# Patient Record
Sex: Male | Born: 1990 | Race: White | Hispanic: No | Marital: Single | Smoking: Current some day smoker
Health system: Southern US, Community
[De-identification: ages and names within clinical notes are randomized; demographics above are authoritative.]

## PROBLEM LIST (undated history)

## (undated) DIAGNOSIS — I10 Essential (primary) hypertension: Secondary | ICD-10-CM

## (undated) DIAGNOSIS — T7840XA Allergy, unspecified, initial encounter: Secondary | ICD-10-CM

## (undated) HISTORY — PX: NO PAST SURGERIES: SHX2092

---
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
## 2010-01-20 ENCOUNTER — Emergency Department: Payer: Self-pay | Admitting: Unknown Physician Specialty

## 2011-03-11 ENCOUNTER — Ambulatory Visit: Payer: Self-pay | Admitting: Internal Medicine

## 2011-11-19 ENCOUNTER — Ambulatory Visit: Payer: Self-pay | Admitting: Emergency Medicine

## 2012-01-23 ENCOUNTER — Emergency Department: Payer: Self-pay | Admitting: Emergency Medicine

## 2015-03-12 ENCOUNTER — Encounter: Payer: Self-pay | Admitting: Emergency Medicine

## 2015-03-12 ENCOUNTER — Ambulatory Visit
Admission: EM | Admit: 2015-03-12 | Discharge: 2015-03-12 | Disposition: A | Discharge status: Home / Self Care | Payer: Federal, State, Local not specified - PPO | Attending: Family Medicine | Admitting: Family Medicine

## 2015-03-12 DIAGNOSIS — J069 Acute upper respiratory infection, unspecified: Secondary | ICD-10-CM | POA: Diagnosis not present

## 2015-03-12 MED ORDER — GUAIFENESIN-CODEINE 100-10 MG/5ML PO SOLN: 5.0000 mL | Freq: Three times a day (TID) | ORAL | Status: DC | PRN

## 2015-03-12 MED ORDER — LORATADINE-PSEUDOEPHEDRINE ER 5-120 MG PO TB12: 1.0000 | ORAL_TABLET | Freq: Two times a day (BID) | ORAL | Status: AC

## 2015-03-12 NOTE — ED Notes (Signed)
Patient c/o cough, sinus pain and congestion for the past 4 days.

## 2015-03-12 NOTE — ED Provider Notes (Signed)
[facility]  ____________________________________________  Time seen: Approximately 9:08 AM  I have reviewed the triage vital signs and the nursing notes.   HISTORY  Chief Complaint Facial Pain and Nasal Congestion   HPI ASMAR BROZEK is a 24 y.o. male presents for complaints of 4 day of runny nose, nasal congestion and intermittent cough. States feels worse at night, and states as day the day goes on "I dont even feel sick." States initially with some sore throat that has now resolved. Denies current pain. Reports continues to eat and drink.  States main complaint is cough at night, and can feel drainage in back of throat. Denies chest pain, wheezing, abdominal pain, fever, weakness or other complaints. Reports has continued to work throughout the week without difficulty.   History reviewed. No pertinent past medical history.  There are no active problems to display for this patient.   History reviewed. No pertinent past surgical history.  No current outpatient prescriptions on file.  Allergies Review of patient's allergies indicates no known allergies.  History reviewed. No pertinent family history.  Social History Social History  Substance Use Topics  . Smoking status: Current Some Day Smoker  . Smokeless tobacco: Former Neurosurgeon  . Alcohol Use: Yes    Review of Systems Constitutional: No fever/chills Eyes: No visual changes. ENT:  Positive runny nose, nasal congestion and intermittent cough.  Cardiovascular: Denies chest pain. Respiratory: Denies shortness of breath. Gastrointestinal: No abdominal pain.  No nausea, no vomiting.  No diarrhea.  No constipation. Genitourinary: Negative for dysuria. Musculoskeletal: Negative for back pain. Skin: Negative for rash. Neurological: Negative for headaches, focal weakness or numbness.  10-point ROS otherwise negative.  ____________________________________________   PHYSICAL EXAM:  VITAL SIGNS: ED Triage  Vitals  Enc Vitals Group     BP 03/12/15 0849 138/57 mmHg     Pulse Rate 03/12/15 0849 72     Resp 03/12/15 0849 16     Temp 03/12/15 0849 96.8 F (36 C)     Temp Source 03/12/15 0849 Tympanic     SpO2 03/12/15 0849 98 %     Weight 03/12/15 0849 240 lb (108.863 kg)     Height 03/12/15 0849  (1.93 m)     Head Cir --      Peak Flow --      Pain Score 03/12/15 0852 2     Pain Loc --      Pain Edu? --      Excl. in GC? --     Constitutional: Alert and oriented. Well appearing and in no acute distress. Eyes: Conjunctivae are normal. PERRL. EOMI. Head: Atraumatic.no sinus TTP, no swelling, no erythema.   Ears: no erythema, normal TMs bilaterally.   Nose: mild clear rhinorrhea, nares patent.   Mouth/Throat: Mucous membranes are moist.  Oropharynx non-erythematous. No tonsillar swelling or exudate.  Neck: No stridor.  No cervical spine tenderness to palpation. Hematological/Lymphatic/Immunilogical: No cervical lymphadenopathy. Cardiovascular: Normal rate, regular rhythm. Grossly normal heart sounds.  Good peripheral circulation. Respiratory: Normal respiratory effort.  No retractions. Lungs CTAB. Gastrointestinal: Soft and nontender. No distention. Normal Bowel sounds.   Musculoskeletal: No lower or upper extremity tenderness nor edema. Bilateral pedal pulses equal and easily palpated.  Neurologic:  Normal speech and language. No gross focal neurologic deficits are appreciated. No gait instability. Skin:  Skin is warm, dry and intact. No rash noted. Psychiatric: Mood and affect are normal. Speech and behavior are normal.  ____________________________________________   LABS (all  labs ordered are listed, but only abnormal results are displayed)  Labs Reviewed - No data to display  INITIAL IMPRESSION / ASSESSMENT AND PLAN / ED COURSE  Pertinent labs & imaging results that were available during my care of the patient were reviewed by me and considered in my medical decision  making (see chart for details).  Very well-appearing patient. No acute distress. Presents for the complaints of 4 days of runny nose, congestion and occasional cough. Lungs clear throughout. Mild clear rhinorrhea. Suspect viral upper respiratory infection. Will treat supportively with prn claritin-d and guaifenesin/codeine, rest, fluids, prn otc ibuprofen and tylenol.   Discussed follow up with Primary care physician this week. Discussed follow up and return parameters including no resolution or any worsening concerns. Patient verbalized understanding and agreed to plan.   ____________________________________________   FINAL CLINICAL IMPRESSION(S) / ED DIAGNOSES  Final diagnoses:  Upper respiratory infection       Renford DillsLindsey Jaylee Freeze, NP 03/12/15 [phone]

## 2015-03-12 NOTE — Discharge Instructions (Signed)
Take medication as prescribed. Rest. Drink plenty of fluids. Take over the counter tylenol or ibuprofen as needed.   Follow up with your primary care physician this week as needed. Return to Urgent care as needed for new or worsening concerns.   Upper Respiratory Infection, Adult Most upper respiratory infections (URIs) are a viral infection of the air passages leading to the lungs. A URI affects the nose, throat, and upper air passages. The most common type of URI is nasopharyngitis and is typically referred to as "the common cold." URIs run their course and usually go away on their own. Most of the time, a URI does not require medical attention, but sometimes a bacterial infection in the upper airways can follow a viral infection. This is called a secondary infection. Sinus and middle ear infections are common types of secondary upper respiratory infections. Bacterial pneumonia can also complicate a URI. A URI can worsen asthma and chronic obstructive pulmonary disease (COPD). Sometimes, these complications can require emergency medical care and may be life threatening.  CAUSES Almost all URIs are caused by viruses. A virus is a type of germ and can spread from one person to another.  RISKS FACTORS You may be at risk for a URI if:   You smoke.   You have chronic heart or lung disease.  You have a weakened defense (immune) system.   You are very young or very old.   You have nasal allergies or asthma.  You work in crowded or poorly ventilated areas.  You work in health care facilities or schools. SIGNS AND SYMPTOMS  Symptoms typically develop 2-3 days after you come in contact with a cold virus. Most viral URIs last 7-10 days. However, viral URIs from the influenza virus (flu virus) can last 14-18 days and are typically more severe. Symptoms may include:   Runny or stuffy (congested) nose.   Sneezing.   Cough.   Sore throat.   Headache.   Fatigue.   Fever.   Loss  of appetite.   Pain in your forehead, behind your eyes, and over your cheekbones (sinus pain).  Muscle aches.  DIAGNOSIS  Your health care provider may diagnose a URI by:  Physical exam.  Tests to check that your symptoms are not due to another condition such as:  Strep throat.  Sinusitis.  Pneumonia.  Asthma. TREATMENT  A URI goes away on its own with time. It cannot be cured with medicines, but medicines may be prescribed or recommended to relieve symptoms. Medicines may help:  Reduce your fever.  Reduce your cough.  Relieve nasal congestion. HOME CARE INSTRUCTIONS   Take medicines only as directed by your health care provider.   Gargle warm saltwater or take cough drops to comfort your throat as directed by your health care provider.  Use a warm mist humidifier or inhale steam from a shower to increase air moisture. This may make it easier to breathe.  Drink enough fluid to keep your urine clear or pale yellow.   Eat soups and other clear broths and maintain good nutrition.   Rest as needed.   Return to work when your temperature has returned to normal or as your health care provider advises. You may need to stay home longer to avoid infecting others. You can also use a face mask and careful hand washing to prevent spread of the virus.  Increase the usage of your inhaler if you have asthma.   Do not use any tobacco products, including  cigarettes, chewing tobacco, or electronic cigarettes. If you need help quitting, ask your health care provider. PREVENTION  The best way to protect yourself from getting a cold is to practice good hygiene.   Avoid oral or hand contact with people with cold symptoms.   Wash your hands often if contact occurs.  There is no clear evidence that vitamin C, vitamin E, echinacea, or exercise reduces the chance of developing a cold. However, it is always recommended to get plenty of rest, exercise, and practice good nutrition.    SEEK MEDICAL CARE IF:   You are getting worse rather than better.   Your symptoms are not controlled by medicine.   You have chills.  You have worsening shortness of breath.  You have brown or red mucus.  You have yellow or brown nasal discharge.  You have pain in your face, especially when you bend forward.  You have a fever.  You have swollen neck glands.  You have pain while swallowing.  You have white areas in the back of your throat. SEEK IMMEDIATE MEDICAL CARE IF:   You have severe or persistent:  Headache.  Ear pain.  Sinus pain.  Chest pain.  You have chronic lung disease and any of the following:  Wheezing.  Prolonged cough.  Coughing up blood.  A change in your usual mucus.  You have a stiff neck.  You have changes in your:  Vision.  Hearing.  Thinking.  Mood. MAKE SURE YOU:   Understand these instructions.  Will watch your condition.  Will get help right away if you are not doing well or get worse.   This information is not intended to replace advice given to you by your health care provider. Make sure you discuss any questions you have with your health care provider.   Document Released: 09/20/2000 Document Revised: 08/11/2014 Document Reviewed: 07/02/2013 Elsevier Interactive Patient Education [employer]2016 Elsevier Inc.

## 2016-04-28 ENCOUNTER — Ambulatory Visit
Admission: EM | Admit: 2016-04-28 | Discharge: 2016-04-28 | Disposition: A | Discharge status: Home / Self Care | Payer: Federal, State, Local not specified - PPO | Attending: Family Medicine | Admitting: Family Medicine

## 2016-04-28 ENCOUNTER — Encounter: Payer: Self-pay | Admitting: Emergency Medicine

## 2016-04-28 DIAGNOSIS — B9789 Other viral agents as the cause of diseases classified elsewhere: Secondary | ICD-10-CM

## 2016-04-28 DIAGNOSIS — J069 Acute upper respiratory infection, unspecified: Secondary | ICD-10-CM | POA: Diagnosis not present

## 2016-04-28 LAB — RAPID STREP SCREEN (MED CTR MEBANE ONLY): Streptococcus, Group A Screen (Direct): NEGATIVE

## 2016-04-28 MED ORDER — CETIRIZINE-PSEUDOEPHEDRINE ER 5-120 MG PO TB12: 1.0000 | ORAL_TABLET | Freq: Two times a day (BID) | ORAL | 0 refills | Status: DC

## 2016-04-28 MED ORDER — FLUTICASONE PROPIONATE 50 MCG/ACT NA SUSP: 2.0000 | Freq: Every day | NASAL | 0 refills | Status: DC

## 2016-04-28 MED ORDER — HYDROCOD POLST-CPM POLST ER 10-8 MG/5ML PO SUER: 5.0000 mL | Freq: Two times a day (BID) | ORAL | 0 refills | Status: DC

## 2016-04-28 NOTE — ED Triage Notes (Signed)
Patient c/o cough and chest congestion for the past 2-3 weeks.   Patient c/o sore throat that started 3 days ago.

## 2016-04-28 NOTE — ED Provider Notes (Signed)
CSN: [id number]     Arrival date & time 04/28/16  4098 History   First MD Initiated Contact with Patient 04/28/16 0919     Chief Complaint  Patient presents with  . Cough   (Consider location/radiation/quality/duration/timing/severity/associated sxs/prior Treatment) HPI This a 26 year old male who presents with chest congestion for about 3 weeks. Complained of the sore throat that started about 3 days ago. The cough is keeping him awake at night. In review of his records he was seen on December 2 here; diagnosed with a URI and provided symptomatic treatment. He did improve somewhat but it returns now with increased coughing. He is a nonsmoker. He has not been running a fever. He did not receive his flu shot this year.    History reviewed. No pertinent past medical history. History reviewed. No pertinent surgical history. History reviewed. No pertinent family history. Social History  Substance Use Topics  . Smoking status: Current Some Day Smoker  . Smokeless tobacco: Former Neurosurgeon  . Alcohol use Yes    Review of Systems  Constitutional: Positive for activity change and chills. Negative for fatigue and fever.  HENT: Positive for congestion, sinus pain, sinus pressure, sneezing, sore throat and voice change.   Respiratory: Positive for cough. Negative for shortness of breath, wheezing and stridor.   All other systems reviewed and are negative.   Allergies  Patient has no known allergies.  Home Medications   Prior to Admission medications   Medication Sig Start Date End Date Taking? Authorizing Provider  cetirizine-pseudoephedrine (ZYRTEC-D) 5-120 MG tablet Take 1 tablet by mouth 2 (two) times daily. 04/28/16   Lutricia Feil, PA-C  chlorpheniramine-HYDROcodone (TUSSIONEX PENNKINETIC ER) 10-8 MG/5ML SUER Take 5 mLs by mouth 2 (two) times daily. 04/28/16   Lutricia Feil, PA-C  fluticasone (FLONASE) 50 MCG/ACT nasal spray Place 2 sprays into both nostrils daily. 04/28/16   Lutricia Feil, PA-C   Meds Ordered and Administered this Visit  Medications - No data to display  BP 131/70 (BP Location: Left Arm)   Pulse 73   Temp 98.1 F (36.7 C) (Oral)   Resp 16   Ht 6\' 4"  (1.93 m)   Wt 235 lb (106.6 kg)   SpO2 100%   BMI 28.61 kg/m  No data found.   Physical Exam  Constitutional: He is oriented to person, place, and time. He appears well-developed and well-nourished. No distress.  HENT:  Head: Normocephalic and atraumatic.  Right Ear: External ear normal.  Left Ear: External ear normal.  Nose: Nose normal.  Mouth/Throat: Oropharynx is clear and moist. No oropharyngeal exudate.  Is no tenderness to percussion over the sinuses  Eyes: EOM are normal. Pupils are equal, round, and reactive to light. Right eye exhibits no discharge. Left eye exhibits no discharge.  Neck: Normal range of motion. Neck supple.  Pulmonary/Chest: Effort normal and breath sounds normal. No respiratory distress. He has no wheezes. He has no rales.  Musculoskeletal: Normal range of motion.  Lymphadenopathy:    He has no cervical adenopathy.  Neurological: He is alert and oriented to person, place, and time.  Skin: Skin is warm and dry. He is not diaphoretic.  Psychiatric: He has a normal mood and affect. His behavior is normal. Judgment and thought content normal.  Nursing note and vitals reviewed.   Urgent Care Course     Procedures (including critical care time)  Labs Review Labs Reviewed  RAPID STREP SCREEN (NOT AT [facility])  CULTURE[facility])    Imaging Review No results found.   Visual Acuity Review  Right Eye Distance:   Left Eye Distance:   Bilateral Distance:    Right Eye Near:   Left Eye Near:    Bilateral Near:     I [place] substance abuse registry with negative findings.    MDM   1. Viral URI with cough    Discharge Medication List as of 04/28/2016  9:35 AM    START taking these medications   Details   cetirizine-pseudoephedrine (ZYRTEC-D) 5-120 MG tablet Take 1 tablet by mouth 2 (two) times daily., Starting Fri 04/28/2016, Normal    chlorpheniramine-HYDROcodone (TUSSIONEX PENNKINETIC ER) 10-8 MG/5ML SUER Take 5 mLs by mouth 2 (two) times daily., Starting Fri 04/28/2016, Print    fluticasone (FLONASE) 50 MCG/ACT nasal spray Place 2 sprays into both nostrils daily., Starting Fri 04/28/2016, Normal      Plan: 1. Test/x-ray results and diagnosis reviewed with patient 2. rx as per orders; risks, benefits, potential side effects reviewed with patient 3. Recommend supportive treatment with Rest and fluids. Will provide him Tussionex to allow him to have better  Rest at nighttime without coughing. He is cautioned regarding performing activities requiring concentration and judgment and not to drive while taking Tussionex. If he is not improving he should follow-up with the primary care physician. 4. F/u prn if symptoms worsen or don't improve     Lutricia FeilWilliam P Zadiel Leyh, PA-C 04/28/16 0945

## 2016-05-01 LAB — CULTURE, GROUP A STREP (THRC)

## 2018-09-23 ENCOUNTER — Other Ambulatory Visit: Payer: Self-pay

## 2018-09-23 ENCOUNTER — Ambulatory Visit
Admission: EM | Admit: 2018-09-23 | Discharge: 2018-09-23 | Disposition: A | Discharge status: Home / Self Care | Payer: Managed Care, Other (non HMO) | Attending: Emergency Medicine | Admitting: Emergency Medicine

## 2018-09-23 DIAGNOSIS — L237 Allergic contact dermatitis due to plants, except food: Secondary | ICD-10-CM

## 2018-09-23 MED ORDER — FAMOTIDINE 20 MG PO TABS: 20.0000 mg | ORAL_TABLET | Freq: Two times a day (BID) | ORAL | 0 refills | Status: DC

## 2018-09-23 MED ORDER — ZYRTEC ALLERGY 10 MG PO CAPS: 10.0000 mg | ORAL_CAPSULE | Freq: Every day | ORAL | Status: DC

## 2018-09-23 MED ORDER — TRIAMCINOLONE ACETONIDE 40 MG/ML IJ SUSP: 80.0000 mg | Freq: Once | INTRAMUSCULAR | Status: DC

## 2018-09-23 MED ORDER — METHYLPREDNISOLONE SODIUM SUCC 125 MG IJ SOLR: 125.0000 mg | Freq: Once | INTRAMUSCULAR | Status: DC

## 2018-09-23 MED ORDER — TRIAMCINOLONE ACETONIDE 40 MG/ML IJ SUSP
80.0000 mg | Freq: Once | INTRAMUSCULAR | Status: AC
  Administered 2018-09-23: 80 mg via INTRAMUSCULAR

## 2018-09-23 NOTE — ED Triage Notes (Signed)
Patient states that he has been having poision ivy on his left arm after riding this weekend. Patient states that the rash has continued to spread and states that he has been using Calamine lotion on it.
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
## 2019-03-10 ENCOUNTER — Ambulatory Visit
Admission: EM | Admit: 2019-03-10 | Discharge: 2019-03-10 | Disposition: A | Discharge status: Home / Self Care | Payer: Managed Care, Other (non HMO) | Attending: Family Medicine | Admitting: Family Medicine

## 2019-03-10 ENCOUNTER — Encounter: Payer: Self-pay | Admitting: Emergency Medicine

## 2019-03-10 ENCOUNTER — Other Ambulatory Visit: Payer: Self-pay

## 2019-03-10 DIAGNOSIS — I10 Essential (primary) hypertension: Secondary | ICD-10-CM

## 2019-03-10 MED ORDER — AMLODIPINE BESYLATE 5 MG PO TABS: 5.0000 mg | ORAL_TABLET | Freq: Every day | ORAL | 1 refills | Status: DC

## 2019-03-10 NOTE — ED Triage Notes (Signed)
Patient in today c/o dizziness x 3 months, getting worse. Patient has started getting headaches x 3 weeks. Originally the dizziness would be when he bent over and stood back up and would only last a couple of seconds. Today patient has been dizzy and headache x 6 hours.

## 2019-03-10 NOTE — Discharge Instructions (Signed)
Medication as directed.  Start back exercising (slowly). Eat right, get adequate sleep. Stop smoking.  Follow up with [facility] clinic.  Take care  Dr. Lacinda Axon

## 2019-03-10 NOTE — ED Provider Notes (Signed)
[facility]    CSN: [id number] Arrival date & time: 03/10/19  1719  History   Chief Complaint Chief Complaint  Patient presents with  . Dizziness   HPI  28 year old male presents with multiple complaints.  Patient reports that he is not felt well for the past 3 months.  He reports ongoing dizziness which has become more frequent.  He states that it feels like he is going to pass out.  Often occurs when he bends over and stands up quickly.  However, it is occurring more frequently now.  Additionally, over the past 3 weeks he has been experiencing frequent headaches.  Located in the frontal region.  States that they last for minutes and sometimes much longer.  He currently has a headache and rates his pain as 5/10 in severity.  He has taken ibuprofen without improvement.  Patient also reports ongoing fatigue and shortness of breath with activity.  Patient states that he is no longer physically active as he was before the COVID-19 pandemic.  He is concerned about his symptoms and has been on the Internet.  He has been putting off coming in for evaluation but states that he can no longer do so.  No relieving factors.  No other reported symptoms.  PMH, Surgical Hx, Family Hx, Social History reviewed and updated as below.  PMH: Elevated BP  Past Surgical History:  Procedure Laterality Date  . NO PAST SURGERIES     Home Medications    Prior to Admission medications   Medication Sig Start Date End Date Taking? Authorizing Provider  amLODipine (NORVASC) 5 MG tablet Take 1 tablet (5 mg total) by mouth daily. 03/10/19   Tommie Sams, DO  Cetirizine HCl (ZYRTEC ALLERGY) 10 MG CAPS Take 1 capsule (10 mg total) by mouth daily for 14 days. 09/23/18 03/10/19  Bailey Mech, NP  famotidine (PEPCID) 20 MG tablet Take 1 tablet (20 mg total) by mouth 2 (two) times daily for 14 days. 09/23/18 03/10/19  Bailey Mech, NP  fluticasone (FLONASE) 50 MCG/ACT nasal spray Place 2 sprays into  both nostrils daily. 04/28/16 03/10/19  Lutricia Feil, PA-C    Family History Family History  Problem Relation Age of Onset  . Colon cancer Mother   . Hypertension Mother   . Thyroid disease Mother   . Diabetes Father   . Hypertension Father     Social History Social History   Tobacco Use  . Smoking status: Current Some Day Smoker    Packs/day: 0.50    Types: Cigarettes  . Smokeless tobacco: Former Engineer, water Use Topics  . Alcohol use: Yes    Comment: occasional  . Drug use: Not Currently     Allergies   Patient has no known allergies.   Review of Systems Review of Systems  Constitutional: Positive for fatigue.  Respiratory: Positive for shortness of breath.   Neurological: Positive for dizziness and headaches.   Physical Exam Triage Vital Signs ED Triage Vitals  Enc Vitals Group     BP 03/10/19 1732 (!) 141/126     Pulse Rate 03/10/19 1732 92     Resp 03/10/19 1732 18     Temp 03/10/19 1732 98.4 F (36.9 C)     Temp Source 03/10/19 1732 Oral     SpO2 03/10/19 1732 100 %     Weight 03/10/19 1731 250 lb (113.4 kg)     Height 03/10/19 1731 6\' 4"  (1.93 m)     Head Circumference --  Peak Flow --      Pain Score 03/10/19 1731 5     Pain Loc --      Pain Edu? --      Excl. in [place]? --    Updated Vital Signs BP (!) 146/97 (BP Location: Right Arm)   Pulse 92   Temp 98.4 F (36.9 C) (Oral)   Resp 18   Ht 6\' 4"  (1.93 m)   Wt 113.4 kg   SpO2 100%   BMI 30.43 kg/m   Visual Acuity Right Eye Distance:   Left Eye Distance:   Bilateral Distance:    Right Eye Near:   Left Eye Near:    Bilateral Near:     Physical Exam Vitals signs and nursing note reviewed.  Constitutional:      General: He is not in acute distress.    Appearance: Normal appearance. He is not ill-appearing.  HENT:     Head: Normocephalic and atraumatic.     Nose: Nose normal.  Eyes:     General:        Right eye: No discharge.        Left eye: No discharge.      Conjunctiva/sclera: Conjunctivae normal.  Cardiovascular:     Rate and Rhythm: Normal rate and regular rhythm.     Heart sounds: No murmur.  Pulmonary:     Effort: Pulmonary effort is normal.     Breath sounds: Normal breath sounds. No wheezing, rhonchi or rales.  Skin:    General: Skin is warm.     Findings: No rash.  Neurological:     General: No focal deficit present.     Mental Status: He is alert and oriented to person, place, and time.  Psychiatric:        Behavior: Behavior normal.     Comments: Anxious. Perseverative.     UC Treatments / Results  Labs (all labs ordered are listed, but only abnormal results are displayed) Labs Reviewed - No data to display  EKG   Radiology No results found.  Procedures Procedures (including critical care time)  Medications Ordered in UC Medications - No data to display  Initial Impression / Assessment and Plan / UC Course  I have reviewed the triage vital signs and the nursing notes.  Pertinent labs & imaging results that were available during my care of the patient were reviewed by me and considered in my medical decision making (see chart for details).    28 year old male presents with ongoing dizziness, headaches.  Also experiencing fatigue and shortness of breath.  Patient hypertensive here.  Has had prior elevated blood pressures without a diagnosis of hypertension.  I believe that hypertension is the culprit of his headaches and dizziness.  In regards to his shortness of breath and fatigue, I believe this is at least in part due to weight gain, decreased activity, and anxiety.  Advised to stop smoking.  Advised to start exercising slowly.  Advised to eat right and get adequate sleep.  Placing on amlodipine.  Advised to follow-up with [facility].  Final Clinical Impressions(s) / UC Diagnoses   Final diagnoses:  Essential hypertension     Discharge Instructions     Medication as directed.  Start back exercising  (slowly). Eat right, get adequate sleep. Stop smoking.  Follow up with [facility] clinic.  Take care  Dr. Lacinda Axon    ED Prescriptions    Medication Sig Dispense Auth. Provider   amLODipine (NORVASC) 5 MG tablet  Take 1 tablet (5 mg total) by mouth daily. 30 tablet Tommie Samsook, Bettyjean Stefanski G, DO     PDMP not reviewed this encounter.   Tommie SamsCook, Jimia Gentles G, [place]DO 03/10/19 2042

## 2019-03-17 DIAGNOSIS — I10 Essential (primary) hypertension: Secondary | ICD-10-CM | POA: Insufficient documentation

## 2019-03-17 DIAGNOSIS — R5383 Other fatigue: Secondary | ICD-10-CM | POA: Insufficient documentation

## 2019-03-17 DIAGNOSIS — F17200 Nicotine dependence, unspecified, uncomplicated: Secondary | ICD-10-CM | POA: Insufficient documentation

## 2019-03-19 DIAGNOSIS — E538 Deficiency of other specified B group vitamins: Secondary | ICD-10-CM | POA: Insufficient documentation

## 2019-03-19 DIAGNOSIS — R7989 Other specified abnormal findings of blood chemistry: Secondary | ICD-10-CM | POA: Insufficient documentation

## 2019-04-14 DIAGNOSIS — G43009 Migraine without aura, not intractable, without status migrainosus: Secondary | ICD-10-CM | POA: Insufficient documentation

## 2019-04-22 ENCOUNTER — Other Ambulatory Visit: Payer: Self-pay

## 2019-04-22 ENCOUNTER — Ambulatory Visit (INDEPENDENT_AMBULATORY_CARE_PROVIDER_SITE_OTHER): Payer: Managed Care, Other (non HMO) | Admitting: Urology

## 2019-04-22 ENCOUNTER — Encounter: Payer: Self-pay | Admitting: Urology

## 2019-04-22 VITALS — BP 143/78 | HR 81 | Ht 75.0 in | Wt 255.0 lb

## 2019-04-22 DIAGNOSIS — R7989 Other specified abnormal findings of blood chemistry: Secondary | ICD-10-CM

## 2019-04-22 NOTE — Progress Notes (Signed)
   04/22/19 4:39 PM   Steve Nielsen 1990-08-18 [id number]  Referring provider: Lorenso Quarry, NP [address] [place],  [place] [address]  CC: Low testosterone  HPI: I saw Steve Nielsen in urology clinic today in consultation for low testosterone from Lorenso Quarry, NP.  He was seen in the emergency department at the end of November with severe headaches and some changes in vision, and found to have hypertension.  The symptoms improved with blood pressure medications.  He also reports 2 to 39-month history of decreased energy, fatigue, and decreased libido.  He denies any erectile dysfunction.  There are no aggravating or alleviating factors.  Severity is mild to moderate.  He denies any gross hematuria or flank pain.  He has 1 son and does not desire further biologic children at this time.  He used to be a heavy drinker with 14-15 beers on the weekend, but reports he has cut back over the last few weeks.  He denies any drug use.  He does not exercise regularly.  A testosterone was checked on 03/17/2019 and was low at 255.   PMH: Migraines  Surgical History: Past Surgical History:  Procedure Laterality Date  . NO PAST SURGERIES      Allergies: No Known Allergies  Family History: Family History  Problem Relation Age of Onset  . Colon cancer Mother   . Hypertension Mother   . Thyroid disease Mother   . Diabetes Father   . Hypertension Father     Social History:  reports that he has been smoking cigarettes. He has been smoking about 0.50 packs per day. He has quit using smokeless tobacco. He reports current alcohol use. He reports previous drug use.  ROS: Please see flowsheet from today's date for complete review of systems.  Physical Exam: BP (!) 143/78 (BP Location: Left Arm, Patient Position: Sitting, Cuff Size: Large)   Pulse 81   Ht 6\' 3"  (1.905 m)   Wt 255 lb (115.7 kg)   BMI 31.87 kg/m    Constitutional:  Alert and oriented, No acute distress. Cardiovascular: No  clubbing, cyanosis, or edema. Respiratory: Normal respiratory effort, no increased work of breathing. GI: Abdomen is soft, nontender, nondistended, no abdominal masses GU: No CVA tenderness Lymph: No cervical or inguinal lymphadenopathy. Skin: No rashes, bruises or suspicious lesions. Neurologic: Grossly intact, no focal deficits, moving all 4 extremities. Psychiatric: Normal mood and affect.  Laboratory Data: Reviewed, see HPI 03/17/2019 Hematocrit 43.2 Testosterone 255  Pertinent Imaging: None to review  Assessment & Plan:   In summary, the patient is a 29 year old male with hypertension and 67-month history of worsening fatigue, decreased energy, and decreased libido.  He has a single low testosterone value of 255.  We discussed the AUA guidelines regarding low testosterone evaluation and replacement and the need for repeat blood work.  We also discussed the role of exogenous testosterone on fertility in the bodies production of natural testosterone.  We also discussed the negative impact of heavy alcohol consumption on testosterone, and the positive impact of exercise.  RTC for repeat testosterone and LH Follow-up with PA to discuss results and possible testosterone replacement  A total of 40 minutes were spent face-to-face with the patient, greater than 50% was spent in patient education, counseling, and coordination of care regarding low testosterone and AUA guidelines regarding work-up and replacement.   2-month, MD  [facility] [address] [place], [place] [phone]

## 2019-04-22 NOTE — Patient Instructions (Addendum)
Testosterone Replacement Therapy  Testosterone replacement therapy (TRT) is used to treat men who have a low testosterone level (hypogonadism). Testosterone is a male hormone that is produced in the testicles. It is responsible for typically male characteristics and for maintaining a man's sex drive and the ability to get an erection. Testosterone also supports bone and muscle health. TRT can be a gel, liquid, or patch that you put on your skin. It can also be in the form of a tablet or an injection. In some cases, your health care provider may insert long-acting pellets under your skin. In most men, the level of testosterone starts to decline gradually after age 45. Low testosterone can also be caused by certain medical conditions, medicines, and obesity. Your health care provider can diagnose hypogonadism with at least two blood tests that are done early in the morning. Low testosterone may not need to be treated. TRT is usually a choice that you make with your health care provider. Your health care provider may recommend TRT if you have low testosterone that is causing symptoms, such as:  Low sex drive.  Erection problems.  Breast enlargement.  Loss of body hair.  Weak muscles or bones.  Shrinking testicles.  Increased body fat.  Low energy.  Hot flashes.  Depression.  Decreased work performance. TRT is a lifetime treatment. If you stop treatment, your testosterone will drop, and your symptoms may return. What are the risks? Testosterone replacement therapy may have side effects, including:  Lower sperm count.  Skin irritation at the application or injection site.  Mouth irritation if you take an oral tablet.  Acne.  Swelling of your legs or feet.  Tender breasts.  Dizziness.  Sleep disturbance.  Mood swings.  Possible increased risk of stroke or heart attack. Testosterone replacement therapy may also increase your risk for prostate cancer or male breast cancer.  You should not use TRT if you have either of those conditions. Your health care provider also may not recommend TRT if:  You are suspected of having prostate cancer.  You want to father a child.  You have a high number of red blood cells.  You have untreated sleep apnea.  You have a very large prostate. Supplies needed:  Your health care provider will prescribe the testosterone gel, solution, or medicine that you need. If your health care provider teaches you to do self-injections at home, you will also need: ? Your medicine vial. ? Disposable needles and syringes. ? Alcohol swabs. ? A needle disposal container. ? Adhesive bandages. How to use testosterone replacement therapy Your health care provider will help you find the TRT option that will work best for you based on your preference, the side effects, and the cost. You may:  Rub testosterone gel on your upper arm or shoulder every day after a shower. This is the most common type of TRT. Do not let women or children come in contact with the gel.  Apply a testosterone solution under your arms once each day.  Place a testosterone patch on your skin once each day.  Dissolve a testosterone tablet in your mouth twice each day.  Have a testosterone pellet inserted under your skin by your health care provider. This will be replaced every 3-6 months.  Use testosterone nasal spray three times each day.  Get testosterone injections. For some types of testosterone, your health care provider will give you this injection. With other types of testosterone, you may be taught to give injections to   yourself. The frequency of injections may vary based on the type of testosterone that you receive. Follow these instructions at home:  Take over-the-counter and prescription medicines only as told by your health care provider.  Lose weight if you are overweight. Ask your health care provider to help you start a healthy diet and exercise program to  reach and maintain a healthy weight.  Work with your health care provider to treat other medical conditions that may lower your testosterone. These include obesity, high blood pressure, high cholesterol, diabetes, liver disease, kidney disease, and sleep apnea.  Keep all follow-up visits as told by your health care provider. This is important. General recommendations  Discuss all risks and benefits with your health care provider before starting therapy.  Work with your health care provider to check your prostate health and do blood testing before you start therapy.  Do not use any testosterone replacement therapies that are not prescribed by your health care provider or not approved for use in the [place]  Do not use TRT for bodybuilding or to improve sexual performance. TRT should be used only to treat symptoms of low testosterone.  Return for all repeat prostate checks and blood tests during therapy, as told by your health care provider. Where to find more information Learn more about testosterone replacement therapy from:  American [employer] Foundation: www.urologyhealth.org/urologic-conditions/low-testosterone-(hypogonadism)  Endocrine Society: www.hormone.org/diseases-and-conditions/mens-health/hypogonadism Contact a health care provider if:  You have side effects from your testosterone replacement therapy.  You continue to have symptoms of low testosterone during treatment.  You develop new symptoms during treatment. Summary  Testosterone replacement therapy is only for men who have low testosterone as determined by blood testing and who have symptoms of low testosterone.  Testosterone replacement therapy should be prescribed only by a health care provider and should be used under the supervision of a health care provider.  You may not be able to take testosterone if you have certain medical conditions, including prostate cancer, male breast cancer, or heart  disease.  Testosterone replacement therapy may have side effects and may make some medical conditions worse.  Talk with your health care provider about all the risks and benefits before you start therapy. This information is not intended to replace advice given to you by your health care provider. Make sure you discuss any questions you have with your health care provider. Document Revised: 04/09/2016 Document Reviewed: 12/16/2015 Elsevier Patient Education  2020 [employer]  

## 2019-04-23 ENCOUNTER — Other Ambulatory Visit
Admission: RE | Admit: 2019-04-23 | Discharge: 2019-04-23 | Disposition: A | Discharge status: Home / Self Care | Payer: Managed Care, Other (non HMO) | Attending: Urology | Admitting: Urology

## 2019-04-23 ENCOUNTER — Other Ambulatory Visit: Payer: Self-pay

## 2019-04-23 DIAGNOSIS — R7989 Other specified abnormal findings of blood chemistry: Secondary | ICD-10-CM | POA: Diagnosis not present

## 2019-04-24 LAB — LUTEINIZING HORMONE: LH: 11.6 m[IU]/mL — ABNORMAL HIGH (ref 1.7–8.6)

## 2019-04-25 LAB — TESTOSTERONE,FREE AND TOTAL
Testosterone, Free: 12.1 pg/mL (ref 9.3–26.5)
Testosterone: 232 ng/dL — ABNORMAL LOW (ref 264–916)

## 2019-04-27 NOTE — Progress Notes (Signed)
04/28/2019 2:39 PM   OSLO HUNTSMAN Nov 11, 1990 [id number]  Referring provider: No referring provider defined for this encounter.  Chief Complaint  Patient presents with  . Hypogonadism    HPI: Mr. Steve Nielsen is a 29 year old male with hypogonadism who presents today for further evaluation and management.  He was seen initially at our clinic on 04/22/2019 with Dr. Richardo Hanks as a referral from Lorenso Quarry, NP for a testosterone of 255 ng/dL.   He has a history of heavy alcohol and marijuana usage.  One biological child and does not desire more children.  A morning serum testosterone level at that visit was 232 ng/dL.    Testosterone deficiency Patient is experiencing a decrease in libido, a lack of energy, a decrease in strength, a decreased enjoyment in life, sadness and/or grumpiness, erections being less strong, a recent deterioration in an ability to play sports, falling asleep after dinner and a recent deterioration in their work performance.  This is indicated by his responses to the ADAM questionnaire.  He is no longer having spontaneous erections at night.   He does not have sleep apnea.  He is reporting obesity and poor memory.   His LH is elevated at 11.6.  He admits to using steroids for a few weeks five years ago.  He does not admit to pituitary issues, but he continues to have headaches and vision issues.     Androgen Deficiency in the Aging Male    Row Name 04/28/19 1300         Androgen Deficiency in the Aging Male   Do you have a decrease in libido (sex drive)  Yes     Do you have lack of energy  Yes     Do you have a decrease in strength and/or endurance  Yes     Have you lost height  No     Have you noticed a decreased "enjoyment of life"  Yes     Are you sad and/or grumpy  Yes     Are your erections less strong  Yes     Have you noticed a recent deterioration in your ability to play sports  Yes     Are you falling asleep after dinner  Yes     Has there been a  recent deterioration in your work performance  Yes       Erectile dysfunction SHIM score: 22    Main complaint: No libido x one year Risk factors:  Hypogonadism and smoking No painful erections or curvatures with his erections.    No longer having spontaneous erections.   SHIM    Row Name 04/28/19 1327         SHIM: Over the last 6 months:   How do you rate your confidence that you could get and keep an erection?  Moderate     When you had erections with sexual stimulation, how often were your erections hard enough for penetration (entering your partner)?  Almost Always or Always     During sexual intercourse, how often were you able to maintain your erection after you had penetrated (entered) your partner?  Almost Always or Always     During sexual intercourse, how difficult was it to maintain your erection to completion of intercourse?  Slightly Difficult     When you attempted sexual intercourse, how often was it satisfactory for you?  Almost Always or Always       SHIM Total Score  SHIM  22        Score: 1-7 Severe ED 8-11 Moderate ED 12-16 Mild-Moderate ED 17-21 Mild ED 22-25 No ED    BPH WITH LUTS  (prostate and/or bladder) IPSS score: 8/2   Major complaint(s):  Frequency and nocturia x years. Denies any dysuria, hematuria or suprapubic pain.   Denies any recent fevers, chills, nausea or vomiting.  He does not have a family history of PCa.  IPSS    Row Name 04/28/19 1300         International Prostate Symptom Score   How often have you had the sensation of not emptying your bladder?  Not at All     How often have you had to urinate less than every two hours?  Almost always     How often have you found you stopped and started again several times when you urinated?  Not at All     How often have you found it difficult to postpone urination?  Less than half the time     How often have you had a weak urinary stream?  Not at All     How often have you had to strain  to start urination?  Not at All     How many times did you typically get up at night to urinate?  1 Time     Total IPSS Score  8       Quality of Life due to urinary symptoms   If you were to spend the rest of your life with your urinary condition just the way it is now how would you feel about that?  Mostly Satisfied        Score:  1-7 Mild 8-19 Moderate 20-35 Severe    PMH: History reviewed. No pertinent past medical history.  Surgical History: Past Surgical History:  Procedure Laterality Date  . NO PAST SURGERIES      Home Medications:  Allergies as of 04/28/2019   No Known Allergies     Medication List       Accurate as of April 28, 2019  2:39 PM. If you have any questions, ask your nurse or doctor.        amLODipine 10 MG tablet Commonly known as: NORVASC Take 10 mg by mouth daily.   [employer] NASAL ALLERGY SPRAY NA SMARTSIG:2 Spray(s) Both Nares Daily   cyanocobalamin 1000 MCG tablet Take 2 tablets daily for 2 weeks, then reduce to 1 tablet daily thereafter for Vitamin B12 Deficiency.   loratadine 10 MG tablet Commonly known as: CLARITIN Take 10 mg by mouth daily.   losartan 50 MG tablet Commonly known as: COZAAR TAKE 1/2 TABLET DAILY FOR 1 WEEK, THEN INCREASE TO 1 WHOLE TABLET.   testosterone cypionate 200 MG/ML injection Commonly known as: DEPOTESTOSTERONE CYPIONATE Inject 1 mL (200 mg total) into the muscle every 14 (fourteen) days. Started by: Michiel Cowboy, PA-C       Allergies: No Known Allergies  Family History: Family History  Problem Relation Age of Onset  . Colon cancer Mother   . Hypertension Mother   . Thyroid disease Mother   . Diabetes Father   . Hypertension Father     Social History:  reports that he has been smoking cigarettes. He has been smoking about 0.50 packs per day. He has quit using smokeless tobacco. He reports current alcohol use. He reports previous drug use.  ROS: UROLOGY Frequent Urination?: Yes Hard to  postpone urination?: No Burning/pain with  urination?: No Get up at night to urinate?: Yes Leakage of urine?: No Urine stream starts and stops?: No Trouble starting stream?: No Do you have to strain to urinate?: No Blood in urine?: No Urinary tract infection?: No Sexually transmitted disease?: No Injury to kidneys or bladder?: No Painful intercourse?: No Weak stream?: No Erection problems?: No Penile pain?: No  Gastrointestinal Nausea?: No Vomiting?: No Indigestion/heartburn?: Yes Diarrhea?: No Constipation?: No  Constitutional Fever: No Night sweats?: No Weight loss?: No Fatigue?: Yes  Skin Skin rash/lesions?: No Itching?: No  Eyes Blurred vision?: No Double vision?: No  Ears/Nose/Throat Sore throat?: No Sinus problems?: No  Hematologic/Lymphatic Swollen glands?: No Easy bruising?: No  Cardiovascular Leg swelling?: No Chest pain?: No  Respiratory Cough?: No Shortness of breath?: Yes  Endocrine Excessive thirst?: Yes  Musculoskeletal Back pain?: Yes Joint pain?: Yes  Neurological Headaches?: Yes Dizziness?: No  Psychologic Depression?: No Anxiety?: No  Physical Exam: BP (!) 146/80   Pulse 85   Ht 6\' 3"  (1.905 m)   Wt 250 lb (113.4 kg)   BMI 31.25 kg/m   Constitutional:  Well nourished. Alert and oriented, No acute distress. HEENT: [place] AT, mask in place.  Trachea midline, no masses. Cardiovascular: No clubbing, cyanosis, or edema. Respiratory: Normal respiratory effort, no increased work of breathing. GI: Abdomen is soft, non tender, non distended, no abdominal masses. Liver and spleen not palpable.  No hernias appreciated.  Stool sample for occult testing is not indicated.   GU: No CVA tenderness.  No bladder fullness or masses.  Patient with circumcised phallus.  Urethral meatus is patent.  No penile discharge. No penile lesions or rashes. Scrotum without lesions, cysts, rashes and/or edema.  Testicles are located scrotally bilaterally.  No masses are appreciated in the testicles. Left and right epididymis are normal. Rectal: Not indicated Skin: No rashes, bruises or suspicious lesions. Lymph: No inguinal adenopathy. Neurologic: Grossly intact, no focal deficits, moving all 4 extremities. Psychiatric: Normal mood and affect.  Laboratory Data: No results found for: WBC, HGB, HCT, MCV, PLT  No results found for: CREATININE  No results found for: PSA  Lab Results  Component Value Date   TESTOSTERONE 232 (L) 04/23/2019    No results found for: HGBA1C  No results found for: TSH  No results found for: CHOL, HDL, CHOLHDL, VLDL, LDLCALC  No results found for: AST No results found for: ALT No components found for: ALKALINEPHOPHATASE No components found for: BILIRUBINTOTAL  No results found for: ESTRADIOL  Urinalysis No results found for: COLORURINE, APPEARANCEUR, LABSPEC, PHURINE, GLUCOSEU, HGBUR, BILIRUBINUR, KETONESUR, PROTEINUR, UROBILINOGEN, NITRITE, LEUKOCYTESUR  I have reviewed the labs.   Pertinent Imaging: No recent imaging    Assessment & Plan:   1. Testosterone deficiency Patient is prescribed testosterone cypionate 200/mL, 1 cc every two weeks He will return next week for 04/25/2019 to teach him self injection He states his girlfriend had a tubal ligation and he is not interested in any further biological children - I did remind him that being on testosterone therapy can make him infertile, he voiced his understanding   Will obtain an MRI of the pituitary gland due to age < 40 and serum testosterone < 250 and he has headaches and visual changes   2. BPH with LUTS IPSS score is 8/2 Continue conservative management, avoiding bladder irritants and timed voiding's RTC in 6 months for IPSS, PSA, PVR and exam, as testosterone therapy can cause prostate enlargement and worsen LUTS  3. Erectile dysfunction:  SHIM score is 22 RTC in 6 months for SHIM score and exam, as testosterone therapy can affect  erections    Return in about 1 week (around 05/05/2019) for testosterone injection teaching .  These notes generated with voice recognition software. I apologize for typographical errors.  Zara Council, PA-C  [facility] [address]  [place], [place] [address] [phone]

## 2019-04-28 ENCOUNTER — Encounter: Payer: Self-pay | Admitting: Urology

## 2019-04-28 ENCOUNTER — Other Ambulatory Visit: Payer: Self-pay

## 2019-04-28 ENCOUNTER — Ambulatory Visit (INDEPENDENT_AMBULATORY_CARE_PROVIDER_SITE_OTHER): Payer: Managed Care, Other (non HMO) | Admitting: Urology

## 2019-04-28 VITALS — BP 146/80 | HR 85 | Ht 75.0 in | Wt 250.0 lb

## 2019-04-28 DIAGNOSIS — R519 Headache, unspecified: Secondary | ICD-10-CM | POA: Diagnosis not present

## 2019-04-28 DIAGNOSIS — E349 Endocrine disorder, unspecified: Secondary | ICD-10-CM | POA: Diagnosis not present

## 2019-04-28 DIAGNOSIS — G8929 Other chronic pain: Secondary | ICD-10-CM | POA: Diagnosis not present

## 2019-04-28 MED ORDER — TESTOSTERONE CYPIONATE 200 MG/ML IM SOLN
200.0000 mg | INTRAMUSCULAR | 0 refills | Status: DC
Start: 2019-04-28 — End: 2019-09-22

## 2019-05-05 ENCOUNTER — Encounter: Payer: Self-pay | Admitting: Urology

## 2019-05-05 ENCOUNTER — Ambulatory Visit (INDEPENDENT_AMBULATORY_CARE_PROVIDER_SITE_OTHER): Payer: Managed Care, Other (non HMO) | Admitting: Urology

## 2019-05-05 ENCOUNTER — Other Ambulatory Visit: Payer: Self-pay

## 2019-05-05 VITALS — BP 130/74 | HR 97 | Ht 75.0 in | Wt 250.0 lb

## 2019-05-05 DIAGNOSIS — E349 Endocrine disorder, unspecified: Secondary | ICD-10-CM

## 2019-05-05 NOTE — Progress Notes (Signed)
Testosterone IM Injection  Due to testosterone deficiency patient is present today for instruction on testosterone injection.  Patient cleansed the area of the right vastus medialis.   He then injected the syringe into the muscle and pulled back to ensure no blood return and then proceeded to inject the testosterone cypionate slowly into the muscle.  He tolerated the procedure well.    He will then return to the clinic one week after his fourth shot for testosterone and prolactin level.

## 2019-05-05 NOTE — Patient Instructions (Signed)

## 2019-05-18 ENCOUNTER — Ambulatory Visit
Admission: RE | Admit: 2019-05-18 | Discharge: 2019-05-18 | Disposition: A | Discharge status: Home / Self Care | Payer: Managed Care, Other (non HMO) | Source: Ambulatory Visit | Attending: Urology | Admitting: Urology

## 2019-05-18 ENCOUNTER — Other Ambulatory Visit: Payer: Self-pay

## 2019-05-18 DIAGNOSIS — R519 Headache, unspecified: Secondary | ICD-10-CM | POA: Diagnosis present

## 2019-05-18 DIAGNOSIS — G8929 Other chronic pain: Secondary | ICD-10-CM | POA: Insufficient documentation

## 2019-05-18 LAB — POCT I-STAT CREATININE: Creatinine, Ser: 0.9 mg/dL (ref 0.61–1.24)

## 2019-05-18 MED ORDER — GADOBUTROL 1 MMOL/ML IV SOLN
10.0000 mL | Freq: Once | INTRAVENOUS | Status: AC | PRN
  Administered 2019-05-18: 10 mL via INTRAVENOUS

## 2019-05-19 ENCOUNTER — Telehealth: Payer: Self-pay | Admitting: Family Medicine

## 2019-05-19 ENCOUNTER — Telehealth: Payer: Self-pay | Admitting: Urology

## 2019-05-19 NOTE — Telephone Encounter (Signed)
Pt states he had a VM stating his MRI was normal, pt wants to know what's next, is that it? what does "normal" mean for him. Pt says he wants to discuss in more detail about his results. Please advise.

## 2019-05-19 NOTE — Telephone Encounter (Signed)
I left a message with a woman to call us back.  It was loud and connection was bad, so I did not get a name.  The MRI of his brain showed no tumors or other abnormalities.  He is to come in one week after his fourth testosterone injection to get his testosterone levels checked.

## 2019-05-19 NOTE — Telephone Encounter (Signed)
LMOM notified of normal MRI

## 2019-05-19 NOTE — Telephone Encounter (Signed)
-----   Message from Harle Battiest, PA-C sent at 05/19/2019  7:53 AM EST ----- Please let Mr. Silveria know that his MRI of his brain was normal.

## 2019-06-23 ENCOUNTER — Other Ambulatory Visit
Admission: RE | Admit: 2019-06-23 | Discharge: 2019-06-23 | Disposition: A | Discharge status: Home / Self Care | Payer: Managed Care, Other (non HMO) | Attending: Urology | Admitting: Urology

## 2019-06-23 DIAGNOSIS — E349 Endocrine disorder, unspecified: Secondary | ICD-10-CM | POA: Insufficient documentation

## 2019-06-24 LAB — TESTOSTERONE: Testosterone: 662 ng/dL (ref 264–916)

## 2019-06-24 LAB — PROLACTIN: Prolactin: 15.2 ng/mL (ref 4.0–15.2)

## 2019-06-25 ENCOUNTER — Other Ambulatory Visit: Payer: Self-pay | Admitting: Family Medicine

## 2019-06-25 ENCOUNTER — Telehealth: Payer: Self-pay | Admitting: Family Medicine

## 2019-06-25 DIAGNOSIS — E349 Endocrine disorder, unspecified: Secondary | ICD-10-CM

## 2019-06-25 NOTE — Telephone Encounter (Signed)
LMOM for patient to return call and schedule lab appointment in 3 months. Orders are in.

## 2019-06-25 NOTE — Telephone Encounter (Signed)
-----   Message from Harle Battiest, PA-C sent at 06/24/2019  8:12 AM EDT ----- Please let Mr. Overturf know that his testosterone level is therapeutic and we need to repeat his testosterone level (one week after injection), HCT and HBG in three months.

## 2019-06-25 NOTE — Telephone Encounter (Signed)
Patient notified orders were changed to [facility] lab per patient request

## 2019-09-15 ENCOUNTER — Other Ambulatory Visit
Admission: RE | Admit: 2019-09-15 | Discharge: 2019-09-15 | Disposition: A | Discharge status: Home / Self Care | Payer: Managed Care, Other (non HMO) | Attending: Urology | Admitting: Urology

## 2019-09-15 ENCOUNTER — Other Ambulatory Visit: Payer: Self-pay

## 2019-09-15 ENCOUNTER — Other Ambulatory Visit: Payer: Self-pay | Admitting: *Deleted

## 2019-09-15 DIAGNOSIS — E349 Endocrine disorder, unspecified: Secondary | ICD-10-CM | POA: Diagnosis not present

## 2019-09-15 LAB — HEMOGLOBIN AND HEMATOCRIT, BLOOD
HCT: 44.9 % (ref 39.0–52.0)
Hemoglobin: 16 g/dL (ref 13.0–17.0)

## 2019-09-16 ENCOUNTER — Telehealth: Payer: Self-pay | Admitting: Family Medicine

## 2019-09-16 ENCOUNTER — Other Ambulatory Visit: Payer: Self-pay | Admitting: Family Medicine

## 2019-09-16 DIAGNOSIS — E349 Endocrine disorder, unspecified: Secondary | ICD-10-CM

## 2019-09-16 LAB — TESTOSTERONE: Testosterone: 533 ng/dL (ref 264–916)

## 2019-09-16 NOTE — Telephone Encounter (Signed)
Patient notified and appointment made for Steve Nielsen.

## 2019-09-16 NOTE — Telephone Encounter (Signed)
-----   Message from Harle Battiest, PA-C sent at 09/16/2019  8:52 AM EDT ----- Please let Mr. Steve Nielsen know that his blood work is within therapeutic levels.  I would like to see him in the office in three months for I PSS, SHIM and exam with testosterone (one week after an injection), hemoglobin and hematocrit.

## 2019-09-19 DIAGNOSIS — K219 Gastro-esophageal reflux disease without esophagitis: Secondary | ICD-10-CM | POA: Insufficient documentation

## 2019-09-22 ENCOUNTER — Other Ambulatory Visit: Payer: Self-pay

## 2019-09-22 MED ORDER — TESTOSTERONE CYPIONATE 200 MG/ML IM SOLN: 200.0000 mg | INTRAMUSCULAR | 0 refills | Status: DC

## 2019-10-02 ENCOUNTER — Encounter: Payer: Self-pay | Admitting: Emergency Medicine

## 2019-10-02 ENCOUNTER — Ambulatory Visit (INDEPENDENT_AMBULATORY_CARE_PROVIDER_SITE_OTHER): Payer: Managed Care, Other (non HMO)

## 2019-10-02 ENCOUNTER — Ambulatory Visit
Admit: 2019-10-02 | Discharge: 2019-10-02 | Disposition: A | Discharge status: Home / Self Care | Payer: Managed Care, Other (non HMO) | Attending: Family Medicine | Admitting: Family Medicine

## 2019-10-02 ENCOUNTER — Ambulatory Visit
Admission: EM | Admit: 2019-10-02 | Discharge: 2019-10-02 | Disposition: A | Discharge status: Home / Self Care | Payer: Managed Care, Other (non HMO) | Attending: Family Medicine | Admitting: Family Medicine

## 2019-10-02 ENCOUNTER — Telehealth: Payer: Self-pay | Admitting: Family Medicine

## 2019-10-02 ENCOUNTER — Other Ambulatory Visit: Payer: Self-pay

## 2019-10-02 DIAGNOSIS — R109 Unspecified abdominal pain: Secondary | ICD-10-CM

## 2019-10-02 DIAGNOSIS — R1031 Right lower quadrant pain: Secondary | ICD-10-CM

## 2019-10-02 LAB — URINALYSIS, COMPLETE (UACMP) WITH MICROSCOPIC
Bilirubin Urine: NEGATIVE
Glucose, UA: 250 mg/dL — AB
Hgb urine dipstick: NEGATIVE
Ketones, ur: NEGATIVE mg/dL
Leukocytes,Ua: NEGATIVE
Nitrite: NEGATIVE
Protein, ur: NEGATIVE mg/dL
RBC / HPF: NONE SEEN RBC/hpf (ref 0–5)
Specific Gravity, Urine: >1.03 — ABNORMAL HIGH (ref 1.005–1.030)
Squamous Epithelial / HPF: NONE SEEN (ref 0–5)
pH: 5 (ref 5.0–8.0)

## 2019-10-02 MED ORDER — KETOROLAC TROMETHAMINE 10 MG PO TABS: 10.0000 mg | ORAL_TABLET | Freq: Four times a day (QID) | ORAL | 0 refills | Status: DC | PRN

## 2019-10-02 NOTE — ED Triage Notes (Signed)
Patient states he coughed this morning and felt a pull in his RLQ. He states since then he feels like someone is stabbing him in the abdomen.

## 2019-10-02 NOTE — ED Notes (Signed)
[facility]. Patient advised of location and that he will be a work-in on the CT schedule.

## 2019-10-02 NOTE — ED Provider Notes (Signed)
[facility]    CSN: [id number] Arrival date & time: 10/02/19  1008   History   Chief Complaint Chief Complaint  Patient presents with  . Abdominal Pain   HPI 29 year old male presents with right lower quadrant pain.  Patient reports that he awoke this morning. He coughed and subsequently developed right lower quadrant pain. Patient reports that since then he has had intermittent, severe, stabbing right lower quadrant pain. Rates his pain as 9/10 in severity when it occurs. He has noted some right-sided back pain as well. No fever. No nausea or vomiting. No reports of anorexia. Seems to be worse when he is moving about. No relieving factors. No other associated symptoms. No other complaints.  Patient Active Problem List   Diagnosis Date Noted  . Migraine without aura and without status migrainosus, not intractable 04/14/2019  . Low vitamin B12 level 03/19/2019  . Current smoker 03/17/2019  . Essential hypertension 03/17/2019  . Lethargy 03/17/2019   Past Surgical History:  Procedure Laterality Date  . NO PAST SURGERIES     Home Medications    Prior to Admission medications   Medication Sig Start Date End Date Taking? Authorizing Provider  amLODipine (NORVASC) 10 MG tablet Take 10 mg by mouth daily. 04/14/19  Yes [provider]  cyanocobalamin 1000 MCG tablet Take 2 tablets daily for 2 weeks, then reduce to 1 tablet daily thereafter for Vitamin B12 Deficiency. 03/19/19  Yes [provider]  loratadine (CLARITIN) 10 MG tablet Take 10 mg by mouth daily. 04/14/19  Yes [provider]  losartan (COZAAR) 50 MG tablet TAKE 1/2 TABLET DAILY FOR 1 WEEK, THEN INCREASE TO 1 WHOLE TABLET. 04/14/19  Yes [provider]  testosterone cypionate (DEPOTESTOSTERONE CYPIONATE) 200 MG/ML injection Inject 1 mL (200 mg total) into the muscle every 14 (fourteen) days. 09/22/19  Yes McGowan, Carollee Herter A, PA-C  ketorolac (TORADOL) 10 MG tablet Take 1 tablet (10 mg  total) by mouth every 6 (six) hours as needed for moderate pain or severe pain. 10/02/19   Tommie Sams, DO  Triamcinolone Acetonide ([employer] NASAL ALLERGY SPRAY NA) SMARTSIG:2 Spray(s) Both Nares Daily 04/14/19   [provider]  Cetirizine HCl (ZYRTEC ALLERGY) 10 MG CAPS Take 1 capsule (10 mg total) by mouth daily for 14 days. 09/23/18 03/10/19  Bailey Mech, NP  famotidine (PEPCID) 20 MG tablet Take 1 tablet (20 mg total) by mouth 2 (two) times daily for 14 days. 09/23/18 03/10/19  Bailey Mech, NP  fluticasone (FLONASE) 50 MCG/ACT nasal spray Place 2 sprays into both nostrils daily. 04/28/16 03/10/19  Lutricia Feil, PA-C    Family History Family History  Problem Relation Age of Onset  . Colon cancer Mother   . Hypertension Mother   . Thyroid disease Mother   . Diabetes Father   . Hypertension Father     Social History Social History   Tobacco Use  . Smoking status: Current Some Day Smoker    Packs/day: 0.50    Types: Cigarettes  . Smokeless tobacco: Former Clinical biochemist  . Vaping Use: Former  Substance Use Topics  . Alcohol use: Yes    Comment: occasional  . Drug use: Not Currently     Allergies   Patient has no known allergies.   Review of Systems Review of Systems  Constitutional: Negative for fever.  Gastrointestinal: Positive for abdominal pain.   Physical Exam Triage Vital Signs ED Triage Vitals  Enc Vitals Group  BP 10/02/19 1016 (!) 117/107     Pulse Rate 10/02/19 1016 (!) 106     Resp 10/02/19 1016 18     Temp 10/02/19 1016 98.4 F (36.9 C)     Temp Source 10/02/19 1016 Oral     SpO2 10/02/19 1016 100 %     Weight 10/02/19 1016 240 lb (108.9 kg)     Height 10/02/19 1016 6\' 3"  (1.905 m)     Head Circumference --      Peak Flow --      Pain Score 10/02/19 1015 9     Pain Loc --      Pain Edu? --      Excl. in [place]? --    Updated Vital Signs BP (!) 117/107 (BP Location: Right Arm)   Pulse (!) 106   Temp 98.4 F (36.9 C)  (Oral)   Resp 18   Ht 6\' 3"  (1.905 m)   Wt 108.9 kg   SpO2 100%   BMI 30.00 kg/m   Visual Acuity Right Eye Distance:   Left Eye Distance:   Bilateral Distance:    Right Eye Near:   Left Eye Near:    Bilateral Near:     Physical Exam Vitals and nursing note reviewed.  Constitutional:      Appearance: Normal appearance.     Comments: Patient in no acute distress. Patient does have paroxysmal pain that doubled him over.  HENT:     Head: Normocephalic and atraumatic.  Eyes:     General:        Right eye: No discharge.        Left eye: No discharge.     Conjunctiva/sclera: Conjunctivae normal.  Cardiovascular:     Rate and Rhythm: Regular rhythm. Tachycardia present.     Heart sounds: No murmur heard.   Pulmonary:     Effort: Pulmonary effort is normal.     Breath sounds: Normal breath sounds. No wheezing, rhonchi or rales.  Abdominal:     Comments: Soft, nondistended. Tender to palpation in the right lower quadrant.  Neurological:     Mental Status: He is alert.  Psychiatric:        Mood and Affect: Mood normal.        Behavior: Behavior normal.    UC Treatments / Results  Labs (all labs ordered are listed, but only abnormal results are displayed) Labs Reviewed  URINALYSIS, COMPLETE (UACMP) WITH MICROSCOPIC - Abnormal; Notable for the following components:      Result Value   Specific Gravity, Urine >1.030 (*)    Glucose, UA 250 (*)    Bacteria, UA RARE (*)    All other components within normal limits    EKG   Radiology CT ABDOMEN PELVIS WO CONTRAST  Result Date: 10/02/2019 CLINICAL DATA:  Acute right lower quadrant abdominal pain. EXAM: CT ABDOMEN AND PELVIS WITHOUT CONTRAST TECHNIQUE: Multidetector CT imaging of the abdomen and pelvis was performed following the standard protocol without IV contrast. COMPARISON:  None. FINDINGS: Lower chest: No acute abnormality. Hepatobiliary: No focal liver abnormality is seen. No gallstones, gallbladder wall thickening,  or biliary dilatation. Pancreas: Unremarkable. No pancreatic ductal dilatation or surrounding inflammatory changes. Spleen: Normal in size without focal abnormality. Adrenals/Urinary Tract: Adrenal glands appear normal. Small nonobstructive left renal calculus is noted. No hydronephrosis or renal obstruction is noted. Urinary bladder is unremarkable. Stomach/Bowel: Stomach is within normal limits. Appendix appears normal. No evidence of bowel wall thickening, distention, or inflammatory changes.  Vascular/Lymphatic: No significant vascular findings are present. No enlarged abdominal or pelvic lymph nodes. Reproductive: Prostate is unremarkable. Other: No abdominal wall hernia or abnormality. No abdominopelvic ascites. Musculoskeletal: No acute or significant osseous findings. IMPRESSION: 1. Small nonobstructive left renal calculus. No hydronephrosis or renal obstruction is noted. 2. No other abnormality seen in the abdomen or pelvis. Electronically Signed   By: Lupita Raider M.D.   On: 10/02/2019 12:54   DG Abd 1 View  Result Date: 10/02/2019 CLINICAL DATA:  Patient states he cough this morning and felt a pull in the right lower quadrant. Now reports he feels like someone is stabbing him in the abdomen. Frequent urination. EXAM: ABDOMEN - 1 VIEW COMPARISON:  None. FINDINGS: The bowel gas pattern is normal. No radio-opaque calculi or other significant radiographic abnormality are seen. IMPRESSION: Negative. Electronically Signed   By: Signa Kell M.D.   On: 10/02/2019 10:48    Procedures Procedures (including critical care time)  Medications Ordered in UC Medications - No data to display  Initial Impression / Assessment and Plan / UC Course  I have reviewed the triage vital signs and the nursing notes.  Pertinent labs & imaging results that were available during my care of the patient were reviewed by me and considered in my medical decision making (see chart for details).    29 year old male  presents with RLQ pain. KUB negative today. UA unremarkable.  Sent for CT abdomen pelvis which revealed a small nonobstructive stone in the left kidney.  No explanation for the patient's right lower quadrant pain via CT.  No acute abdomen.  Likely MSK in nature.  Toradol as needed.  Supportive care.  Work note given.  Final Clinical Impressions(s) / UC Diagnoses   Final diagnoses:  Abdominal pain     Discharge Instructions     I will call you with the results.  Take care  Dr. Adriana Simas     ED Prescriptions    None     PDMP not reviewed this encounter.   Tommie Sams, [place] 10/02/19 1314

## 2019-10-02 NOTE — ED Notes (Signed)
Patient has been approved for CPT 74176 (CT ABD Pelvis W/O). Obtained via [employer] . Valid for 10/02/2019 through 12/31/2019. Authorization # [id number].

## 2019-10-02 NOTE — Telephone Encounter (Signed)
CT results given to patient via phone. Treating with Toradol. Fluids. Supportive care.  Everlene Other DO [facility]

## 2019-10-02 NOTE — Discharge Instructions (Signed)
I will call you with the results.  Take care  Dr. Adriana Simas

## 2019-12-26 ENCOUNTER — Other Ambulatory Visit: Payer: Self-pay

## 2019-12-26 ENCOUNTER — Other Ambulatory Visit
Admission: RE | Admit: 2019-12-26 | Discharge: 2019-12-26 | Disposition: A | Discharge status: Home / Self Care | Payer: Managed Care, Other (non HMO) | Attending: Urology | Admitting: Urology

## 2019-12-26 DIAGNOSIS — E349 Endocrine disorder, unspecified: Secondary | ICD-10-CM

## 2019-12-26 LAB — HEMOGLOBIN AND HEMATOCRIT, BLOOD
HCT: 39.4 % (ref 39.0–52.0)
Hemoglobin: 14.1 g/dL (ref 13.0–17.0)

## 2019-12-27 LAB — TESTOSTERONE: Testosterone: 1005 ng/dL — ABNORMAL HIGH (ref 264–916)

## 2019-12-28 NOTE — Progress Notes (Deleted)
12/29/2019 7:36 PM   LADONTE VERSTRAETE 06-16-1990 [id number]  Referring provider: Lorenso Quarry, NP [address] [place],  [place] [address]  No chief complaint on file.   HPI: Mr. Rogan is a 29 year old male with hypogonadism who presents today for follow up.    Testosterone deficiency He is still having/no longer having spontaneous erections at night. ***He has sleep apnea and is sleeping with a CPAP machine.  He has sleep apnea and is not sleeping with a CPAP machine.  He does not have sleep apnea. *** Component     Latest Ref Rng & Units 12/26/2019  Testosterone     264 - 916 ng/dL 7,408 (H)   Component     Latest Ref Rng & Units 12/26/2019  Hemoglobin     13.0 - 17.0 g/dL 14.4  HCT     39 - 52 % 39.4    Erectile dysfunction SHIM: ***     Previous SHIM score: 22    Main complaint: No libido x one year Risk factors:  Hypogonadism and smoking No painful erections or curvatures with his erections.    No longer having spontaneous erections.     Score: 1-7 Severe ED 8-11 Moderate ED 12-16 Mild-Moderate ED 17-21 Mild ED 22-25 No ED    BPH WITH LUTS  (prostate and/or bladder) I PSS: ***      Previous IPSS score: 8/2   Major complaint(s):  Frequency and nocturia x years. Denies any dysuria, hematuria or suprapubic pain.   Denies any recent fevers, chills, nausea or vomiting.  He does not have a family history of PCa.    Score:  1-7 Mild 8-19 Moderate 20-35 Severe  Small left renal stone found incidentally on CT scan 09/2019  PMH: No past medical history on file.  Surgical History: Past Surgical History:  Procedure Laterality Date  . NO PAST SURGERIES      Home Medications:  Allergies as of 12/29/2019   No Known Allergies     Medication List       Accurate as of December 28, 2019  7:36 PM. If you have any questions, ask your nurse or doctor.        amLODipine 10 MG tablet Commonly known as: NORVASC Take 10 mg by mouth daily.   [employer]  NASAL ALLERGY SPRAY NA SMARTSIG:2 Spray(s) Both Nares Daily   cyanocobalamin 1000 MCG tablet Take 2 tablets daily for 2 weeks, then reduce to 1 tablet daily thereafter for Vitamin B12 Deficiency.   ketorolac 10 MG tablet Commonly known as: TORADOL Take 1 tablet (10 mg total) by mouth every 6 (six) hours as needed for moderate pain or severe pain.   loratadine 10 MG tablet Commonly known as: CLARITIN Take 10 mg by mouth daily.   losartan 50 MG tablet Commonly known as: COZAAR TAKE 1/2 TABLET DAILY FOR 1 WEEK, THEN INCREASE TO 1 WHOLE TABLET.   testosterone cypionate 200 MG/ML injection Commonly known as: DEPOTESTOSTERONE CYPIONATE Inject 1 mL (200 mg total) into the muscle every 14 (fourteen) days.       Allergies: No Known Allergies  Family History: Family History  Problem Relation Age of Onset  . Colon cancer Mother   . Hypertension Mother   . Thyroid disease Mother   . Diabetes Father   . Hypertension Father     Social History:  reports that he has been smoking cigarettes. He has been smoking about 0.50 packs per day. He has quit  using smokeless tobacco. He reports current alcohol use. He reports previous drug use.  ROS: For pertinent review of systems please refer to history of present illness  Physical Exam: There were no vitals taken for this visit.  Constitutional:  Well nourished. Alert and oriented, No acute distress. HEENT: [place] AT, moist mucus membranes.  Trachea midline Cardiovascular: No clubbing, cyanosis, or edema. Respiratory: Normal respiratory effort, no increased work of breathing. GI: Abdomen is soft, non tender, non distended, no abdominal masses. Liver and spleen not palpable.  No hernias appreciated.  Stool sample for occult testing is not indicated.   GU: No CVA tenderness.  No bladder fullness or masses.  Patient with circumcised/uncircumcised phallus. ***Foreskin easily retracted***  Urethral meatus is patent.  No penile discharge. No penile  lesions or rashes. Scrotum without lesions, cysts, rashes and/or edema.  Testicles are located scrotally bilaterally. No masses are appreciated in the testicles. Left and right epididymis are normal. Rectal: Patient with  normal sphincter tone. Anus and perineum without scarring or rashes. No rectal masses are appreciated. Prostate is approximately *** grams, *** nodules are appreciated. Seminal vesicles are normal. Skin: No rashes, bruises or suspicious lesions. Lymph: No inguinal adenopathy. Neurologic: Grossly intact, no focal deficits, moving all 4 extremities. Psychiatric: Normal mood and affect.  Laboratory Data: Lab Results  Component Value Date   HGB 14.1 12/26/2019   HCT 39.4 12/26/2019    Lab Results  Component Value Date   CREATININE 0.90 05/18/2019   Lab Results  Component Value Date   TESTOSTERONE 1,005 (H) 12/26/2019    Urinalysis    Component Value Date/Time   COLORURINE YELLOW 10/02/2019 1024   APPEARANCEUR CLEAR 10/02/2019 1024   LABSPEC >1.030 (H) 10/02/2019 1024   PHURINE 5.0 10/02/2019 1024   GLUCOSEU 250 (A) 10/02/2019 1024   HGBUR NEGATIVE 10/02/2019 1024   BILIRUBINUR NEGATIVE 10/02/2019 1024   KETONESUR NEGATIVE 10/02/2019 1024   PROTEINUR NEGATIVE 10/02/2019 1024   NITRITE NEGATIVE 10/02/2019 1024   LEUKOCYTESUR NEGATIVE 10/02/2019 1024   I have reviewed the labs.   Pertinent Imaging: CLINICAL DATA:  Acute right lower quadrant abdominal pain.  EXAM: CT ABDOMEN AND PELVIS WITHOUT CONTRAST  TECHNIQUE: Multidetector CT imaging of the abdomen and pelvis was performed following the standard protocol without IV contrast.  COMPARISON:  None.  FINDINGS: Lower chest: No acute abnormality.  Hepatobiliary: No focal liver abnormality is seen. No gallstones, gallbladder wall thickening, or biliary dilatation.  Pancreas: Unremarkable. No pancreatic ductal dilatation or surrounding inflammatory changes.  Spleen: Normal in size without  focal abnormality.  Adrenals/Urinary Tract: Adrenal glands appear normal. Small nonobstructive left renal calculus is noted. No hydronephrosis or renal obstruction is noted. Urinary bladder is unremarkable.  Stomach/Bowel: Stomach is within normal limits. Appendix appears normal. No evidence of bowel wall thickening, distention, or inflammatory changes.  Vascular/Lymphatic: No significant vascular findings are present. No enlarged abdominal or pelvic lymph nodes.  Reproductive: Prostate is unremarkable.  Other: No abdominal wall hernia or abnormality. No abdominopelvic ascites.  Musculoskeletal: No acute or significant osseous findings.  IMPRESSION: 1. Small nonobstructive left renal calculus. No hydronephrosis or renal obstruction is noted. 2. No other abnormality seen in the abdomen or pelvis.   Electronically Signed   By: Lupita Raider M.D.   On: 10/02/2019 12:54 I have independently reviewed the films.  See HPI.    Assessment & Plan:   1. Testosterone deficiency Testosterone is above therapeutic levels Patient is prescribed testosterone cypionate 200/mL, 1 cc every  two weeks  2. BPH with LUTS IPSS score is 8/2 Continue conservative management, avoiding bladder irritants and timed voiding's RTC in 6 months for IPSS, PSA, PVR and exam, as testosterone therapy can cause prostate enlargement and worsen LUTS  3. Erectile dysfunction:    SHIM score is 22 RTC in 6 months for SHIM score and exam, as testosterone therapy can affect erections    No follow-ups on file.  These notes generated with voice recognition software. I apologize for typographical errors.  Michiel Cowboy, PA-C  [facility] [address]  [address] [place], [place] [address] [phone]

## 2019-12-29 ENCOUNTER — Telehealth: Payer: Self-pay | Admitting: Urology

## 2019-12-29 ENCOUNTER — Ambulatory Visit: Payer: Self-pay | Admitting: Urology

## 2019-12-29 NOTE — Telephone Encounter (Signed)
Although pt missed his appt this morning and has rescheduled for next week, pt insists to talk to [facility] for his results.  Explained to him that is what the appt was made for and she will go over those results at his next appt made for next week.  Please advise.

## 2020-01-04 NOTE — Progress Notes (Signed)
01/05/2020 3:39 PM   Steve Nielsen 20-Jul-1990 [id number]  Referring provider: Lorenso Quarry, NP [address] [place],  [place] [address]  Chief Complaint  Patient presents with  . Follow-up    follow-up    HPI: Steve Nielsen is a 29 year old male with hypogonadism who presents today for follow up.    Testosterone deficiency He is still having spontaneous erections at night.  He does not have sleep apnea.  He is managing his hypogonadism with testosterone cypionate 200mg /cc, 1 cc every two weeks.   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    Component     Latest Ref Rng & Units 12/26/2019  Testosterone     264 - 916 ng/dL 12/28/2019 (H)   Component     Latest Ref Rng & Units 12/26/2019  Hemoglobin     13.0 - 17.0 g/dL 12/28/2019  HCT     39 - 52 % 39.4    Erectile dysfunction SHIM: 25    Previous SHIM score: 22    Main complaint: No libido x one year Risk factors:  Hypogonadism and smoking No painful erections or curvatures with his erections.    Having spontaneous erections.    SHIM    Row Name 01/05/20 1527         SHIM: Over the last 6 months:   How do you rate your confidence that you could get and keep an erection? Very High     When you had erections with sexual stimulation, how often were your erections hard enough for penetration (entering your partner)? Almost Always or Always     During sexual intercourse, how often were you able to maintain your erection after you had penetrated (entered) your partner? Almost Always or Always     During sexual intercourse, how difficult was it to maintain your erection to completion of intercourse? Not Difficult     When you attempted sexual intercourse, how often was it satisfactory for you? Almost Always or Always       SHIM Total Score   SHIM 25            Score: 1-7 Severe ED 8-11 Moderate ED 12-16 Mild-Moderate ED 17-21 Mild ED 22-25 No ED  BPH WITH LUTS  (prostate  and/or bladder) I PSS: 3/1      Previous IPSS score: 8/2     Major complaint(s):  None.  Patient denies any modifying or aggravating factors.  Patient denies any gross hematuria, dysuria or suprapubic/flank pain.  Patient denies any fevers, chills, nausea or vomiting.    IPSS    Row Name 01/05/20 1500         International Prostate Symptom Score   How often have you had the sensation of not emptying your bladder? Less than 1 in 5     How often have you had to urinate less than every two hours? Not at All     How often have you found you stopped and started again several times when you urinated? Not at All     How often have you found it difficult to postpone urination? Less than 1 in 5 times     How often have you had a weak urinary stream? Not at All     How often have you had to strain to start urination? Not at All     How many times did you typically get up  at night to urinate? 1 Time     Total IPSS Score 3       Quality of Life due to urinary symptoms   If you were to spend the rest of your life with your urinary condition just the way it is now how would you feel about that? Pleased            Score:  1-7 Mild 8-19 Moderate 20-35 Severe  Small left renal stone found incidentally on CT scan 09/2019  PMH: No past medical history on file.  Surgical History: Past Surgical History:  Procedure Laterality Date  . NO PAST SURGERIES      Home Medications:  Allergies as of 01/05/2020   No Known Allergies     Medication List       Accurate as of January 05, 2020  3:39 PM. If you have any questions, ask your nurse or doctor.        STOP taking these medications   [employer] NASAL ALLERGY SPRAY NA Stopped by: Jaki Hammerschmidt, PA-C   famotidine 20 MG tablet Commonly known as: PEPCID Stopped by: Avril Busser, PA-C   fluticasone 50 MCG/ACT nasal spray Commonly known as: FLONASE Stopped by: Oliviarose Punch, PA-C   ketorolac 10 MG tablet Commonly known as:  TORADOL Stopped by: Wardell Pokorski, PA-C   ZyrTEC Allergy 10 MG Caps Generic drug: Cetirizine HCl Stopped by: Eneida Evers, PA-C     TAKE these medications   amLODipine 10 MG tablet Commonly known as: NORVASC Take 10 mg by mouth daily.   cyanocobalamin 1000 MCG tablet Take 2 tablets daily for 2 weeks, then reduce to 1 tablet daily thereafter for Vitamin B12 Deficiency.   loratadine 10 MG tablet Commonly known as: CLARITIN Take 10 mg by mouth daily.   losartan 50 MG tablet Commonly known as: COZAAR TAKE 1/2 TABLET DAILY FOR 1 WEEK, THEN INCREASE TO 1 WHOLE TABLET.   testosterone cypionate 200 MG/ML injection Commonly known as: DEPOTESTOSTERONE CYPIONATE Inject 1 mL (200 mg total) into the muscle every 14 (fourteen) days.       Allergies: No Known Allergies  Family History: Family History  Problem Relation Age of Onset  . Colon cancer Mother   . Hypertension Mother   . Thyroid disease Mother   . Diabetes Father   . Hypertension Father     Social History:  reports that he has been smoking cigarettes. He has been smoking about 0.50 packs per day. He has quit using smokeless tobacco. He reports current alcohol use. He reports previous drug use.  ROS: For pertinent review of systems please refer to history of present illness  Physical Exam: BP (!) 147/72   Pulse (!) 109   Ht 6\' 4"  (1.93 m)   Wt 221 lb (100.2 kg)   BMI 26.90 kg/m   Constitutional:  Well nourished. Alert and oriented, No acute distress. HEENT: [place] AT, mask in place.  Trachea midline Cardiovascular: No clubbing, cyanosis, or edema. Respiratory: Normal respiratory effort, no increased work of breathing. GU: No CVA tenderness.  No bladder fullness or masses.  Patient with circumcised phallus. Urethral meatus is patent.  No penile discharge. No penile lesions or rashes. Scrotum without lesions, cysts, rashes and/or edema.  Testicles are located scrotally bilaterally. They are retractile.  No masses  are appreciated in the testicles. Left and right epididymis are normal. lSkin: No rashes, bruises or suspicious lesions. Lymph: No inguinal adenopathy. Neurologic: Grossly intact, no focal deficits, moving all 4 extremities.  Psychiatric: Normal mood and affect.  Laboratory Data: Lab Results  Component Value Date   HGB 14.1 12/26/2019   HCT 39.4 12/26/2019    Lab Results  Component Value Date   CREATININE 0.90 05/18/2019   Lab Results  Component Value Date   TESTOSTERONE 1,005 (H) 12/26/2019    Urinalysis    Component Value Date/Time   COLORURINE YELLOW 10/02/2019 1024   APPEARANCEUR CLEAR 10/02/2019 1024   LABSPEC >1.030 (H) 10/02/2019 1024   PHURINE 5.0 10/02/2019 1024   GLUCOSEU 250 (A) 10/02/2019 1024   HGBUR NEGATIVE 10/02/2019 1024   BILIRUBINUR NEGATIVE 10/02/2019 1024   KETONESUR NEGATIVE 10/02/2019 1024   PROTEINUR NEGATIVE 10/02/2019 1024   NITRITE NEGATIVE 10/02/2019 1024   LEUKOCYTESUR NEGATIVE 10/02/2019 1024   I have reviewed the labs.   Pertinent Imaging: CLINICAL DATA:  Acute right lower quadrant abdominal pain.  EXAM: CT ABDOMEN AND PELVIS WITHOUT CONTRAST  TECHNIQUE: Multidetector CT imaging of the abdomen and pelvis was performed following the standard protocol without IV contrast.  COMPARISON:  None.  FINDINGS: Lower chest: No acute abnormality.  Hepatobiliary: No focal liver abnormality is seen. No gallstones, gallbladder wall thickening, or biliary dilatation.  Pancreas: Unremarkable. No pancreatic ductal dilatation or surrounding inflammatory changes.  Spleen: Normal in size without focal abnormality.  Adrenals/Urinary Tract: Adrenal glands appear normal. Small nonobstructive left renal calculus is noted. No hydronephrosis or renal obstruction is noted. Urinary bladder is unremarkable.  Stomach/Bowel: Stomach is within normal limits. Appendix appears normal. No evidence of bowel wall thickening, distention,  or inflammatory changes.  Vascular/Lymphatic: No significant vascular findings are present. No enlarged abdominal or pelvic lymph nodes.  Reproductive: Prostate is unremarkable.  Other: No abdominal wall hernia or abnormality. No abdominopelvic ascites.  Musculoskeletal: No acute or significant osseous findings.  IMPRESSION: 1. Small nonobstructive left renal calculus. No hydronephrosis or renal obstruction is noted. 2. No other abnormality seen in the abdomen or pelvis.   Electronically Signed   By: Lupita Raider M.D.   On: 10/02/2019 12:54 I have independently reviewed the films.  See HPI.    Assessment & Plan:   1. Testosterone deficiency Testosterone is above therapeutic levels Patient is prescribed testosterone cypionate 200/mL, 1 cc every two weeks - we will reduce to 0.75 cc every two weeks  RTC in 3 months for testosterone level (one week after injection), HCT and Hgb  2. BPH with LUTS IPSS score is 3/1, it is improved  Continue conservative management, avoiding bladder irritants and timed voiding's RTC in 6 months for IPSS, PSA, PVR and exam, as testosterone therapy can cause prostate enlargement and worsen LUTS  3. Erectile dysfunction:    SHIM score is 25, it is improved RTC in 6 months for SHIM score and exam, as testosterone therapy can affect erections    Return in about 3 months (around 04/05/2020) for HCT, HBG, testosterone (one week after injection).  These notes generated with voice recognition software. I apologize for typographical errors.  Michiel Cowboy, PA-C  [facility] [address]  [address] [place], [place] [address] [phone]

## 2020-01-05 ENCOUNTER — Ambulatory Visit (INDEPENDENT_AMBULATORY_CARE_PROVIDER_SITE_OTHER): Payer: Managed Care, Other (non HMO) | Admitting: Urology

## 2020-01-05 ENCOUNTER — Other Ambulatory Visit: Payer: Self-pay

## 2020-01-05 ENCOUNTER — Encounter: Payer: Self-pay | Admitting: Urology

## 2020-01-05 VITALS — BP 147/72 | HR 109 | Ht 76.0 in | Wt 221.0 lb

## 2020-01-05 DIAGNOSIS — N401 Enlarged prostate with lower urinary tract symptoms: Secondary | ICD-10-CM

## 2020-01-05 DIAGNOSIS — N138 Other obstructive and reflux uropathy: Secondary | ICD-10-CM | POA: Diagnosis not present

## 2020-01-05 DIAGNOSIS — E349 Endocrine disorder, unspecified: Secondary | ICD-10-CM

## 2020-01-05 MED ORDER — TESTOSTERONE CYPIONATE 200 MG/ML IM SOLN: INTRAMUSCULAR | 2 refills | Status: DC

## 2020-01-05 MED ORDER — "BD DISP NEEDLES 18G X 1-1/2"" MISC": 1.0000 mg | 0 refills | Status: AC

## 2020-01-05 MED ORDER — SYRINGE 2-3 ML 3 ML MISC: 1.0000 mg | 3 refills | Status: AC

## 2020-01-05 MED ORDER — "NEEDLE (DISP) 23G X 1"" MISC": 0.7500 mL | 0 refills | Status: AC

## 2020-02-11 ENCOUNTER — Other Ambulatory Visit: Payer: Self-pay | Admitting: Urology

## 2020-02-11 DIAGNOSIS — E349 Endocrine disorder, unspecified: Secondary | ICD-10-CM

## 2020-04-05 ENCOUNTER — Ambulatory Visit: Payer: Managed Care, Other (non HMO) | Admitting: Urology

## 2020-04-18 NOTE — Progress Notes (Deleted)
04/19/2020 6:24 PM   Steve Nielsen Jul 14, 1990 [id number]  Referring provider: Lorenso Quarry, NP [address] [place],  [place] [address]  No chief complaint on file.   HPI: Steve Nielsen is a 30 year old male with hypogonadism who presents today for follow up.    Testosterone deficiency He is still having spontaneous erections at night.  He does not have sleep apnea.  He is managing his hypogonadism with testosterone cypionate 200mg /cc, 1 cc every two weeks.   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    Component     Latest Ref Rng & Units 12/26/2019  Testosterone     264 - 916 ng/dL 12/28/2019 (H)   Component     Latest Ref Rng & Units 12/26/2019  Hemoglobin     13.0 - 17.0 g/dL 12/28/2019  HCT     39 - 52 % 39.4    Erectile dysfunction SHIM: 25    Previous SHIM score: 22    Main complaint: No libido x one year Risk factors:  Hypogonadism and smoking No painful erections or curvatures with his erections.    Having spontaneous erections.     Score: 1-7 Severe ED 8-11 Moderate ED 12-16 Mild-Moderate ED 17-21 Mild ED 22-25 No ED  BPH WITH LUTS  (prostate and/or bladder) I PSS: 3/1      Previous IPSS score: 8/2     Major complaint(s):  None.  Patient denies any modifying or aggravating factors.  Patient denies any gross hematuria, dysuria or suprapubic/flank pain.  Patient denies any fevers, chills, nausea or vomiting.     Score:  1-7 Mild 8-19 Moderate 20-35 Severe  Small left renal stone found incidentally on CT scan 09/2019  PMH: No past medical history on file.  Surgical History: Past Surgical History:  Procedure Laterality Date  . NO PAST SURGERIES      Home Medications:  Allergies as of 04/19/2020   No Known Allergies     Medication List       Accurate as of April 18, 2020  6:24 PM. If you have any questions, ask your nurse or doctor.        2-3CC SYRINGE 3 ML Misc 1 mg by Does not apply route every  14 (fourteen) days.   amLODipine 10 MG tablet Commonly known as: NORVASC Take 10 mg by mouth daily.   BD Disp Needles 18G X 1-1/2" Misc Generic drug: NEEDLE (DISP) 18 G 1 mg by Does not apply route every 14 (fourteen) days.   cyanocobalamin 1000 MCG tablet Take 2 tablets daily for 2 weeks, then reduce to 1 tablet daily thereafter for Vitamin B12 Deficiency.   loratadine 10 MG tablet Commonly known as: CLARITIN Take 10 mg by mouth daily.   losartan 50 MG tablet Commonly known as: COZAAR TAKE 1/2 TABLET DAILY FOR 1 WEEK, THEN INCREASE TO 1 WHOLE TABLET.   NEEDLE (DISP) 23 G 23G X 1" Misc 0.75 mLs by Does not apply route every 14 (fourteen) days.   testosterone cypionate 200 MG/ML injection Commonly known as: DEPOTESTOSTERONE CYPIONATE INJECT 1 ML (200 MG TOTAL) INTO THE MUSCLE EVERY 14 (FOURTEEN) DAYS.       Allergies: No Known Allergies  Family History: Family History  Problem Relation Age of Onset  . Colon cancer Mother   . Hypertension Mother   . Thyroid disease Mother   . Diabetes Father   . Hypertension Father  Social History:  reports that he has been smoking cigarettes. He has been smoking about 0.50 packs per day. He has quit using smokeless tobacco. He reports current alcohol use. He reports previous drug use.  ROS: For pertinent review of systems please refer to history of present illness  Physical Exam: There were no vitals taken for this visit.  Constitutional:  Well nourished. Alert and oriented, No acute distress. HEENT: [place] AT, mask in place.  Trachea midline Cardiovascular: No clubbing, cyanosis, or edema. Respiratory: Normal respiratory effort, no increased work of breathing. GU: No CVA tenderness.  No bladder fullness or masses.  Patient with circumcised phallus. Urethral meatus is patent.  No penile discharge. No penile lesions or rashes. Scrotum without lesions, cysts, rashes and/or edema.  Testicles are located scrotally bilaterally. They are  retractile.  No masses are appreciated in the testicles. Left and right epididymis are normal. lSkin: No rashes, bruises or suspicious lesions. Lymph: No inguinal adenopathy. Neurologic: Grossly intact, no focal deficits, moving all 4 extremities. Psychiatric: Normal mood and affect.  Laboratory Data: Lab Results  Component Value Date   HGB 14.1 12/26/2019   HCT 39.4 12/26/2019    Lab Results  Component Value Date   CREATININE 0.90 05/18/2019   Lab Results  Component Value Date   TESTOSTERONE 1,005 (H) 12/26/2019    Urinalysis    Component Value Date/Time   COLORURINE YELLOW 10/02/2019 1024   APPEARANCEUR CLEAR 10/02/2019 1024   LABSPEC >1.030 (H) 10/02/2019 1024   PHURINE 5.0 10/02/2019 1024   GLUCOSEU 250 (A) 10/02/2019 1024   HGBUR NEGATIVE 10/02/2019 1024   BILIRUBINUR NEGATIVE 10/02/2019 1024   KETONESUR NEGATIVE 10/02/2019 1024   PROTEINUR NEGATIVE 10/02/2019 1024   NITRITE NEGATIVE 10/02/2019 1024   LEUKOCYTESUR NEGATIVE 10/02/2019 1024   I have reviewed the labs.   Pertinent Imaging: CLINICAL DATA:  Acute right lower quadrant abdominal pain.  EXAM: CT ABDOMEN AND PELVIS WITHOUT CONTRAST  TECHNIQUE: Multidetector CT imaging of the abdomen and pelvis was performed following the standard protocol without IV contrast.  COMPARISON:  None.  FINDINGS: Lower chest: No acute abnormality.  Hepatobiliary: No focal liver abnormality is seen. No gallstones, gallbladder wall thickening, or biliary dilatation.  Pancreas: Unremarkable. No pancreatic ductal dilatation or surrounding inflammatory changes.  Spleen: Normal in size without focal abnormality.  Adrenals/Urinary Tract: Adrenal glands appear normal. Small nonobstructive left renal calculus is noted. No hydronephrosis or renal obstruction is noted. Urinary bladder is unremarkable.  Stomach/Bowel: Stomach is within normal limits. Appendix appears normal. No evidence of bowel wall thickening,  distention, or inflammatory changes.  Vascular/Lymphatic: No significant vascular findings are present. No enlarged abdominal or pelvic lymph nodes.  Reproductive: Prostate is unremarkable.  Other: No abdominal wall hernia or abnormality. No abdominopelvic ascites.  Musculoskeletal: No acute or significant osseous findings.  IMPRESSION: 1. Small nonobstructive left renal calculus. No hydronephrosis or renal obstruction is noted. 2. No other abnormality seen in the abdomen or pelvis.   Electronically Signed   By: Lupita Raider M.D.   On: 10/02/2019 12:54 I have independently reviewed the films.  See HPI.    Assessment & Plan:   1. Testosterone deficiency Testosterone is above therapeutic levels Patient is prescribed testosterone cypionate 200/mL, 1 cc every two weeks - we will reduce to 0.75 cc every two weeks  RTC in 3 months for testosterone level (one week after injection), HCT and Hgb  2. BPH with LUTS IPSS score is 3/1, it is improved  Continue  conservative management, avoiding bladder irritants and timed voiding's RTC in 6 months for IPSS, PSA, PVR and exam, as testosterone therapy can cause prostate enlargement and worsen LUTS  3. Erectile dysfunction:    SHIM score is 25, it is improved RTC in 6 months for SHIM score and exam, as testosterone therapy can affect erections    No follow-ups on file.  These notes generated with voice recognition software. I apologize for typographical errors.  Michiel Cowboy, PA-C  [facility] [address]  [address] [place], [place] [address] [phone]

## 2020-04-19 ENCOUNTER — Ambulatory Visit: Payer: Managed Care, Other (non HMO) | Admitting: Urology

## 2020-04-19 ENCOUNTER — Other Ambulatory Visit: Payer: Self-pay

## 2020-04-19 DIAGNOSIS — E538 Deficiency of other specified B group vitamins: Secondary | ICD-10-CM

## 2020-04-19 DIAGNOSIS — E349 Endocrine disorder, unspecified: Secondary | ICD-10-CM

## 2020-04-19 DIAGNOSIS — R7989 Other specified abnormal findings of blood chemistry: Secondary | ICD-10-CM

## 2020-04-20 ENCOUNTER — Other Ambulatory Visit: Payer: Self-pay

## 2020-04-20 ENCOUNTER — Other Ambulatory Visit
Admission: RE | Admit: 2020-04-20 | Discharge: 2020-04-20 | Disposition: A | Discharge status: Home / Self Care | Payer: Managed Care, Other (non HMO) | Attending: Urology | Admitting: Urology

## 2020-04-20 DIAGNOSIS — E538 Deficiency of other specified B group vitamins: Secondary | ICD-10-CM | POA: Diagnosis present

## 2020-04-20 DIAGNOSIS — R7989 Other specified abnormal findings of blood chemistry: Secondary | ICD-10-CM | POA: Diagnosis present

## 2020-04-20 LAB — HEMOGLOBIN AND HEMATOCRIT, BLOOD
HCT: 43.2 % (ref 39.0–52.0)
Hemoglobin: 15.5 g/dL (ref 13.0–17.0)

## 2020-04-21 LAB — TESTOSTERONE: Testosterone: 89 ng/dL — ABNORMAL LOW (ref 264–916)

## 2020-04-25 NOTE — Progress Notes (Deleted)
04/26/2020 8:52 AM   Steve Nielsen 02, 1992 [id number]  Referring provider: Lorenso Quarry, NP [address] [place],  [place] [address]  No chief complaint on file.  Urological history 1. Testosterone deficiency - injecting testosterone cypionate 200mg /mL, 0.75 cc every two weeks - testosterone 89 ng/dL on - hemoglobin and HCT are WNL - still having spontaneous erections at night  2. ED - SHIM 25 - risk factors of testosterone deficiency and smoking - Patient still having spontaneous erections.   He denies any pain or curvature with erections.   3. Nephrolithiasis - Small left renal stone found incidentally on CT scan 09/2019   HPI: Steve Nielsen is a 30 y.o. male who presents today for a three month follow up.        PMH: No past medical history on file.  Surgical History: Past Surgical History:  Procedure Laterality Date  . NO PAST SURGERIES      Home Medications:  Allergies as of 04/26/2020   No Known Allergies     Medication List       Accurate as of April 25, 2020  8:52 AM. If you have any questions, ask your nurse or doctor.        2-3CC SYRINGE 3 ML Misc 1 mg by Does not apply route every 14 (fourteen) days.   amLODipine 10 MG tablet Commonly known as: NORVASC Take 10 mg by mouth daily.   BD Disp Needles 18G X 1-1/2" Misc Generic drug: NEEDLE (DISP) 18 G 1 mg by Does not apply route every 14 (fourteen) days.   cyanocobalamin 1000 MCG tablet Take 2 tablets daily for 2 weeks, then reduce to 1 tablet daily thereafter for Vitamin B12 Deficiency.   loratadine 10 MG tablet Commonly known as: CLARITIN Take 10 mg by mouth daily.   losartan 50 MG tablet Commonly known as: COZAAR TAKE 1/2 TABLET DAILY FOR 1 WEEK, THEN INCREASE TO 1 WHOLE TABLET.   NEEDLE (DISP) 23 G 23G X 1" Misc 0.75 mLs by Does not apply route every 14 (fourteen) days.   testosterone cypionate 200 MG/ML injection Commonly known as:  DEPOTESTOSTERONE CYPIONATE INJECT 1 ML (200 MG TOTAL) INTO THE MUSCLE EVERY 14 (FOURTEEN) DAYS.       Allergies: No Known Allergies  Family History: Family History  Problem Relation Age of Onset  . Colon cancer Mother   . Hypertension Mother   . Thyroid disease Mother   . Diabetes Father   . Hypertension Father     Social History:  reports that he has been smoking cigarettes. He has been smoking about 0.50 packs per day. He has quit using smokeless tobacco. He reports current alcohol use. He reports previous drug use.  ROS: For pertinent review of systems please refer to history of present illness  Physical Exam: There were no vitals taken for this visit.  Constitutional:  Well nourished. Alert and oriented, No acute distress. HEENT:  AT, moist mucus membranes.  Trachea midline Cardiovascular: No clubbing, cyanosis, or edema. Respiratory: Normal respiratory effort, no increased work of breathing. GI: Abdomen is soft, non tender, non distended, no abdominal masses. Liver and spleen not palpable.  No hernias appreciated.  Stool sample for occult testing is not indicated.   GU: No CVA tenderness.  No bladder fullness or masses.  Patient with circumcised/uncircumcised phallus. ***Foreskin easily retracted***  Urethral meatus is patent.  No penile discharge. No penile lesions or rashes. Scrotum without lesions, cysts, rashes and/or edema.  Testicles are located scrotally bilaterally. No masses are appreciated in the testicles. Left and right epididymis are normal. Rectal: Patient with  normal sphincter tone. Anus and perineum without scarring or rashes. No rectal masses are appreciated. Prostate is approximately *** grams, *** nodules are appreciated. Seminal vesicles are normal. Skin: No rashes, bruises or suspicious lesions. Lymph: No inguinal adenopathy. Neurologic: Grossly intact, no focal deficits, moving all 4 extremities. Psychiatric: Normal mood and affect.   Laboratory  Data: Lab Results  Component Value Date   HGB 15.5 04/20/2020   HCT 43.2 04/20/2020    Lab Results  Component Value Date   TESTOSTERONE 89 (L) 04/20/2020   I have reviewed the labs.   Pertinent Imaging: No imaging since last visit   Assessment & Plan:   1. Testosterone deficiency ***  2. Erectile dysfunction:    ***  3. Nephrolithiasis - will follow clinically    No follow-ups on file.  These notes generated with voice recognition software. I apologize for typographical errors.  Michiel Cowboy, PA-C  [facility] [address]  [address] [place], [place] [address] [phone]

## 2020-04-26 ENCOUNTER — Ambulatory Visit: Payer: Managed Care, Other (non HMO) | Admitting: Urology

## 2020-04-26 DIAGNOSIS — E349 Endocrine disorder, unspecified: Secondary | ICD-10-CM

## 2020-04-26 DIAGNOSIS — N529 Male erectile dysfunction, unspecified: Secondary | ICD-10-CM

## 2020-04-26 DIAGNOSIS — N200 Calculus of kidney: Secondary | ICD-10-CM

## 2020-05-09 NOTE — Progress Notes (Incomplete)
05/10/2020 11:11 PM   FAHED MORTEN [birth year] [id number]  Referring provider: Lorenso Quarry, NP [address] [place],  [place] [address]  No chief complaint on file.  Urological history 1. Testosterone deficiency - injecting testosterone cypionate 200mg /mL, 0.75 cc every two weeks - testosterone 89 ng/dL on - hemoglobin and HCT are WNL - still having spontaneous erections at night  2. ED - SHIM 25 - risk factors of testosterone deficiency and smoking - Patient still having spontaneous erections.   He denies any pain or curvature with erections.   3. Nephrolithiasis - Small left renal stone found incidentally on CT scan 09/2019   HPI: Steve Nielsen is a 30 y.o. male who presents today for a three month follow up.        PMH: No past medical history on file.  Surgical History: Past Surgical History:  Procedure Laterality Date  . NO PAST SURGERIES      Home Medications:  Allergies as of 05/10/2020   No Known Allergies     Medication List       Accurate as of May 09, 2020 11:11 PM. If you have any questions, ask your nurse or doctor.        2-3CC SYRINGE 3 ML Misc 1 mg by Does not apply route every 14 (fourteen) days.   amLODipine 10 MG tablet Commonly known as: NORVASC Take 10 mg by mouth daily.   BD Disp Needles 18G X 1-1/2" Misc Generic drug: NEEDLE (DISP) 18 G 1 mg by Does not apply route every 14 (fourteen) days.   cyanocobalamin 1000 MCG tablet Take 2 tablets daily for 2 weeks, then reduce to 1 tablet daily thereafter for Vitamin B12 Deficiency.   loratadine 10 MG tablet Commonly known as: CLARITIN Take 10 mg by mouth daily.   losartan 50 MG tablet Commonly known as: COZAAR TAKE 1/2 TABLET DAILY FOR 1 WEEK, THEN INCREASE TO 1 WHOLE TABLET.   NEEDLE (DISP) 23 G 23G X 1" Misc 0.75 mLs by Does not apply route every 14 (fourteen) days.   testosterone cypionate 200 MG/ML injection Commonly known as:  DEPOTESTOSTERONE CYPIONATE INJECT 1 ML (200 MG TOTAL) INTO THE MUSCLE EVERY 14 (FOURTEEN) DAYS.       Allergies: No Known Allergies  Family History: Family History  Problem Relation Age of Onset  . Colon cancer Mother   . Hypertension Mother   . Thyroid disease Mother   . Diabetes Father   . Hypertension Father     Social History:  reports that he has been smoking cigarettes. He has been smoking about 0.50 packs per day. He has quit using smokeless tobacco. He reports current alcohol use. He reports previous drug use.  ROS: For pertinent review of systems please refer to history of present illness  Physical Exam: There were no vitals taken for this visit.  Constitutional:  Well nourished. Alert and oriented, No acute distress. HEENT: [place] AT, moist mucus membranes.  Trachea midline Cardiovascular: No clubbing, cyanosis, or edema. Respiratory: Normal respiratory effort, no increased work of breathing. GI: Abdomen is soft, non tender, non distended, no abdominal masses. Liver and spleen not palpable.  No hernias appreciated.  Stool sample for occult testing is not indicated.   GU: No CVA tenderness.  No bladder fullness or masses.  Patient with circumcised/uncircumcised phallus. ***Foreskin easily retracted***  Urethral meatus is patent.  No penile discharge. No penile lesions or rashes. Scrotum without lesions, cysts, rashes and/or edema.  Testicles are located scrotally bilaterally. No masses are appreciated in the testicles. Left and right epididymis are normal. Rectal: Patient with  normal sphincter tone. Anus and perineum without scarring or rashes. No rectal masses are appreciated. Prostate is approximately *** grams, *** nodules are appreciated. Seminal vesicles are normal. Skin: No rashes, bruises or suspicious lesions. Lymph: No inguinal adenopathy. Neurologic: Grossly intact, no focal deficits, moving all 4 extremities. Psychiatric: Normal mood and affect.   Laboratory  Data: Lab Results  Component Value Date   HGB 15.5 04/20/2020   HCT 43.2 04/20/2020    Lab Results  Component Value Date   TESTOSTERONE 89 (L) 04/20/2020   I have reviewed the labs.   Pertinent Imaging: No imaging since last visit   Assessment & Plan:   1. Testosterone deficiency ***  2. Erectile dysfunction:    ***  3. Nephrolithiasis - will follow clinically    No follow-ups on file.  These notes generated with voice recognition software. I apologize for typographical errors.  Michiel Cowboy, PA-C  [facility] [address]  [address] [place], [place] [address] [phone]

## 2020-05-09 NOTE — Progress Notes (Deleted)
  05/10/2020 11:11 PM   [name] 07/13/1990 [id number]  Referring provider: Leach, Jerris Keltz, NP [address] [place],  [place] [address]  No chief complaint on file.  Urological history 1. Testosterone deficiency - injecting testosterone cypionate 200mg/mL, 0.75 cc every two weeks - testosterone 89 ng/dL on 04/20/2020 - hemoglobin and HCT are WNL - still having spontaneous erections at night  2. ED - SHIM 25 - risk factors of testosterone deficiency and smoking - Patient still having spontaneous erections.   He denies any pain or curvature with erections.   3. Nephrolithiasis - Small left renal stone found incidentally on CT scan 09/2019   HPI: Steve Nielsen is a 30 y.o. male who presents today for a three month follow up.        PMH: No past medical history on file.  Surgical History: Past Surgical History:  Procedure Laterality Date  . NO PAST SURGERIES      Home Medications:  Allergies as of 05/10/2020   No Known Allergies     Medication List       Accurate as of May 09, 2020 11:11 PM. If you have any questions, ask your nurse or doctor.        2-3CC SYRINGE 3 ML Misc 1 mg by Does not apply route every 14 (fourteen) days.   amLODipine 10 MG tablet Commonly known as: NORVASC Take 10 mg by mouth daily.   BD Disp Needles 18G X 1-1/2" Misc Generic drug: NEEDLE (DISP) 18 G 1 mg by Does not apply route every 14 (fourteen) days.   cyanocobalamin 1000 MCG tablet Take 2 tablets daily for 2 weeks, then reduce to 1 tablet daily thereafter for Vitamin B12 Deficiency.   loratadine 10 MG tablet Commonly known as: CLARITIN Take 10 mg by mouth daily.   losartan 50 MG tablet Commonly known as: COZAAR TAKE 1/2 TABLET DAILY FOR 1 WEEK, THEN INCREASE TO 1 WHOLE TABLET.   NEEDLE (DISP) 23 G 23G X 1" Misc 0.75 mLs by Does not apply route every 14 (fourteen) days.   testosterone cypionate 200 MG/ML injection Commonly known as:  DEPOTESTOSTERONE CYPIONATE INJECT 1 ML (200 MG TOTAL) INTO THE MUSCLE EVERY 14 (FOURTEEN) DAYS.       Allergies: No Known Allergies  Family History: Family History  Problem Relation Age of Onset  . Colon cancer Mother   . Hypertension Mother   . Thyroid disease Mother   . Diabetes Father   . Hypertension Father     Social History:  reports that he has been smoking cigarettes. He has been smoking about 0.50 packs per day. He has quit using smokeless tobacco. He reports current alcohol use. He reports previous drug use.  ROS: For pertinent review of systems please refer to history of present illness  Physical Exam: There were no vitals taken for this visit.  Constitutional:  Well nourished. Alert and oriented, No acute distress. HEENT: [place] AT, moist mucus membranes.  Trachea midline Cardiovascular: No clubbing, cyanosis, or edema. Respiratory: Normal respiratory effort, no increased work of breathing. GI: Abdomen is soft, non tender, non distended, no abdominal masses. Liver and spleen not palpable.  No hernias appreciated.  Stool sample for occult testing is not indicated.   GU: No CVA tenderness.  No bladder fullness or masses.  Patient with circumcised/uncircumcised phallus. ***Foreskin easily retracted***  Urethral meatus is patent.  No penile discharge. No penile lesions or rashes. Scrotum without lesions, cysts, rashes and/or edema.    Testicles are located scrotally bilaterally. No masses are appreciated in the testicles. Left and right epididymis are normal. Rectal: Patient with  normal sphincter tone. Anus and perineum without scarring or rashes. No rectal masses are appreciated. Prostate is approximately *** grams, *** nodules are appreciated. Seminal vesicles are normal. Skin: No rashes, bruises or suspicious lesions. Lymph: No inguinal adenopathy. Neurologic: Grossly intact, no focal deficits, moving all 4 extremities. Psychiatric: Normal mood and affect.   Laboratory  Data: Lab Results  Component Value Date   HGB 15.5 04/20/2020   HCT 43.2 04/20/2020    Lab Results  Component Value Date   TESTOSTERONE 89 (L) 04/20/2020   I have reviewed the labs.   Pertinent Imaging: No imaging since last visit   Assessment & Plan:   1. Testosterone deficiency ***  2. Erectile dysfunction:    ***  3. Nephrolithiasis - will follow clinically    No follow-ups on file.  These notes generated with voice recognition software. I apologize for typographical errors.  Michiel Cowboy, PA-C  [facility] [address]  [address] [place], [place] [address] [phone]

## 2020-05-10 ENCOUNTER — Ambulatory Visit: Payer: Managed Care, Other (non HMO) | Admitting: Urology

## 2020-05-10 DIAGNOSIS — E349 Endocrine disorder, unspecified: Secondary | ICD-10-CM

## 2020-05-10 DIAGNOSIS — N529 Male erectile dysfunction, unspecified: Secondary | ICD-10-CM

## 2020-05-10 DIAGNOSIS — N200 Calculus of kidney: Secondary | ICD-10-CM

## 2020-05-14 NOTE — Progress Notes (Signed)
05/17/2020 11:25 AM   Steve Nielsen 01/04/1991 [id number]  Referring provider: Lorenso Quarry, NP [address] [place],  [place] [address]  Chief Complaint  Patient presents with  . Follow-up    follow-up   Urological history 1. Testosterone deficiency - injecting testosterone cypionate 200mg /mL, 0.75 cc every two weeks - testosterone 89 ng/dL on - hemoglobin and HCT are WNL - still having spontaneous erections at night  2. ED - SHIM 23 - risk factors of testosterone deficiency and smoking  3. Nephrolithiasis - Small left renal stone found incidentally on CT scan 09/2019   HPI: Steve Nielsen is a 30 y.o. male who presents today for a three month follow up.   Patient denies any modifying or aggravating factors.  Patient denies any gross hematuria, dysuria or suprapubic/flank pain.  Patient denies any fevers, chills, nausea or vomiting.   Patient still having spontaneous erections.  He denies any pain or curvature with erections.     SHIM    Row Name 05/17/20 1104         SHIM: Over the last 6 months:   How do you rate your confidence that you could get and keep an erection? Moderate     When you had erections with sexual stimulation, how often were your erections hard enough for penetration (entering your partner)? Almost Always or Always     During sexual intercourse, how often were you able to maintain your erection after you had penetrated (entered) your partner? Almost Always or Always     During sexual intercourse, how difficult was it to maintain your erection to completion of intercourse? Not Difficult     When you attempted sexual intercourse, how often was it satisfactory for you? Almost Always or Always           SHIM Total Score   SHIM 23             PMH: No past medical history on file.  Surgical History: Past Surgical History:  Procedure Laterality Date  . NO PAST SURGERIES      Home Medications:  Allergies as  of 05/17/2020   No Known Allergies     Medication List       Accurate as of May 17, 2020 11:25 AM. If you have any questions, ask your nurse or doctor.        2-3CC SYRINGE 3 ML Misc 1 mg by Does not apply route every 14 (fourteen) days.   amLODipine 10 MG tablet Commonly known as: NORVASC Take 10 mg by mouth daily.   BD Disp Needles 18G X 1-1/2" Misc Generic drug: NEEDLE (DISP) 18 G 1 mg by Does not apply route every 14 (fourteen) days.   cyanocobalamin 1000 MCG tablet Take 2 tablets daily for 2 weeks, then reduce to 1 tablet daily thereafter for Vitamin B12 Deficiency.   loratadine 10 MG tablet Commonly known as: CLARITIN Take 10 mg by mouth daily.   losartan 50 MG tablet Commonly known as: COZAAR TAKE 1/2 TABLET DAILY FOR 1 WEEK, THEN INCREASE TO 1 WHOLE TABLET.   NEEDLE (DISP) 23 G 23G X 1" Misc 0.75 mLs by Does not apply route every 14 (fourteen) days.   testosterone cypionate 200 MG/ML injection Commonly known as: DEPOTESTOSTERONE CYPIONATE INJECT 1 ML (200 MG TOTAL) INTO THE MUSCLE EVERY 14 (FOURTEEN) DAYS.       Allergies: No Known Allergies  Family History: Family History  Problem Relation Age of  Onset  . Colon cancer Mother   . Hypertension Mother   . Thyroid disease Mother   . Diabetes Father   . Hypertension Father     Social History:  reports that he has been smoking cigarettes. He has been smoking about 0.50 packs per day. He has quit using smokeless tobacco. He reports current alcohol use. He reports previous drug use.  ROS: For pertinent review of systems please refer to history of present illness  Physical Exam: BP (!) 152/79   Pulse (!) 101   Ht 6\' 3"  (1.905 m)   Wt 224 lb (101.6 kg)   BMI 28.00 kg/m   Constitutional:  Well nourished. Alert and oriented, No acute distress. HEENT: [place] AT, mask in place.  Trachea midline Cardiovascular: No clubbing, cyanosis, or edema. Respiratory: Normal respiratory effort, no increased work of  breathing. Neurologic: Grossly intact, no focal deficits, moving all 4 extremities. Psychiatric: Normal mood and affect.   Laboratory Data: Lab Results  Component Value Date   HGB 15.5 04/20/2020   HCT 43.2 04/20/2020    Lab Results  Component Value Date   TESTOSTERONE 89 (L) 04/20/2020   Specimen:  Blood  Ref Range & Units 2 wk ago  WBC (White Blood Cell Count) 4.1 - 10.2 10^3/uL 6.7   RBC (Red Blood Cell Count) 4.69 - 6.13 10^6/uL 4.38Low   Hemoglobin 14.1 - 18.1 gm/dL 06/18/2020   Hematocrit 84.6 - 52.0 % 40.3   MCV (Mean Corpuscular Volume) 80.0 - 100.0 fl 92.0   MCH (Mean Corpuscular Hemoglobin) 27.0 - 31.2 pg 33.1High   MCHC (Mean Corpuscular Hemoglobin Concentration) 32.0 - 36.0 gm/dL 96.2   Platelet Count 95.2 - 450 10^3/uL 171   RDW-CV (Red Cell Distribution Width) 11.6 - 14.8 % 12.6   MPV (Mean Platelet Volume) 9.4 - 12.4 fl 11.2   Neutrophils 1.50 - 7.80 10^3/uL 4.50   Lymphocytes 1.00 - 3.60 10^3/uL 1.40   Mixed Count 0.10 - 0.90 10^3/uL 0.80   Neutrophil % 32.0 - 70.0 % 66.6   Lymphocyte % 10.0 - 50.0 % 20.9   Mixed % 3.0 - 14.4 % 12.5   Resulting Agency  [facility]  Specimen Collected: 05/03/20 3:28 PM Last Resulted: 05/03/20 4:22 PM  Received From: [facility]  Result Received: 05/09/20 11:11 PM   Related to CBC w/auto Differential (3 Part) Component 05/03/20 03/17/19  WBC (White Blood Cell Count) 6.7 4.0Low  RBC (Red Blood Cell Count) 4.38 4.65Low  Hemoglobin 14.5 15.7  Hematocrit 40.3 43.2  MCV (Mean Corpuscular Volume) 92.0 92.9  MCH (Mean Corpuscular Hemoglobin) 33.1 33.8High  MCHC (Mean Corpuscular Hemoglobin Concentration) 36.0 36.3High  Platelet Count 171 159  RDW-CV (Red Cell Distribution Width) 12.6 11.6  MPV (Mean Platelet Volume) 11.2 12.2  Neutrophils 4.50 1.80  Lymphocytes 1.40 1.60  Mixed Count 0.80 0.60  Neutrophil % 66.6 45.7  Lymphocyte % 20.9 40.5  Mixed % 12.5 13.8  I have reviewed  the labs.   Pertinent Imaging: No imaging since last visit   Assessment & Plan:   1. Testosterone deficiency - discussed changing treatment modalities as he is having issues with consistency with injections and obtaining needles, but he is not wanting to change treatment types at this time   2. Erectile dysfunction:    - no issues when consistent with injections   3. Nephrolithiasis - will follow clinically   Return in about 6 months (around 11/14/2020) for testosterone (one week after injection)  H & H, SHIM and exam.  These notes generated with voice recognition software. I apologize for typographical errors.  Michiel Cowboy, PA-C  [facility] [address]  [address] [place], [place] [address] [phone]  I spent 25 minutes on the day of the encounter to include pre-visit record review, face-to-face time with the patient, and post-visit ordering of tests.

## 2020-05-17 ENCOUNTER — Ambulatory Visit (INDEPENDENT_AMBULATORY_CARE_PROVIDER_SITE_OTHER): Payer: Managed Care, Other (non HMO) | Admitting: Urology

## 2020-05-17 ENCOUNTER — Other Ambulatory Visit: Payer: Self-pay

## 2020-05-17 ENCOUNTER — Encounter: Payer: Self-pay | Admitting: Urology

## 2020-05-17 VITALS — BP 152/79 | HR 101 | Ht 75.0 in | Wt 224.0 lb

## 2020-05-17 DIAGNOSIS — N529 Male erectile dysfunction, unspecified: Secondary | ICD-10-CM

## 2020-05-17 DIAGNOSIS — E349 Endocrine disorder, unspecified: Secondary | ICD-10-CM | POA: Diagnosis not present

## 2020-05-17 MED ORDER — TESTOSTERONE CYPIONATE 200 MG/ML IM SOLN: INTRAMUSCULAR | 0 refills | Status: DC

## 2020-08-25 ENCOUNTER — Other Ambulatory Visit: Payer: Self-pay

## 2020-08-25 ENCOUNTER — Ambulatory Visit
Admission: EM | Admit: 2020-08-25 | Discharge: 2020-08-25 | Disposition: A | Discharge status: Home / Self Care | Payer: Managed Care, Other (non HMO) | Attending: Family Medicine | Admitting: Family Medicine

## 2020-08-25 DIAGNOSIS — E860 Dehydration: Secondary | ICD-10-CM | POA: Diagnosis present

## 2020-08-25 DIAGNOSIS — K529 Noninfective gastroenteritis and colitis, unspecified: Secondary | ICD-10-CM

## 2020-08-25 LAB — CBC WITH DIFFERENTIAL/PLATELET
Abs Immature Granulocytes: 0.02 10*3/uL (ref 0.00–0.07)
Basophils Absolute: 0 10*3/uL (ref 0.0–0.1)
Basophils Relative: 0 %
Eosinophils Absolute: 0 10*3/uL (ref 0.0–0.5)
Eosinophils Relative: 1 %
HCT: 44.4 % (ref 39.0–52.0)
Hemoglobin: 15.9 g/dL (ref 13.0–17.0)
Immature Granulocytes: 0 %
Lymphocytes Relative: 14 %
Lymphs Abs: 0.7 10*3/uL (ref 0.7–4.0)
MCH: 33.3 pg (ref 26.0–34.0)
MCHC: 35.8 g/dL (ref 30.0–36.0)
MCV: 92.9 fL (ref 80.0–100.0)
Monocytes Absolute: 0.3 10*3/uL (ref 0.1–1.0)
Monocytes Relative: 6 %
Neutro Abs: 4 10*3/uL (ref 1.7–7.7)
Neutrophils Relative %: 79 %
Platelets: 165 10*3/uL (ref 150–400)
RBC: 4.78 MIL/uL (ref 4.22–5.81)
RDW: 12.4 % (ref 11.5–15.5)
WBC: 5.1 10*3/uL (ref 4.0–10.5)
nRBC: 0 % (ref 0.0–0.2)

## 2020-08-25 LAB — COMPREHENSIVE METABOLIC PANEL
ALT: 17 U/L (ref 0–44)
AST: 17 U/L (ref 15–41)
Albumin: 3.8 g/dL (ref 3.5–5.0)
Alkaline Phosphatase: 54 U/L (ref 38–126)
Anion gap: 7 (ref 5–15)
BUN: 17 mg/dL (ref 6–20)
CO2: 28 mmol/L (ref 22–32)
Calcium: 8.6 mg/dL — ABNORMAL LOW (ref 8.9–10.3)
Chloride: 103 mmol/L (ref 98–111)
Creatinine, Ser: 1.07 mg/dL (ref 0.61–1.24)
GFR, Estimated: >60 mL/min (ref >60)
Glucose, Bld: 85 mg/dL (ref 70–99)
Potassium: 3.4 mmol/L — ABNORMAL LOW (ref 3.5–5.1)
Sodium: 138 mmol/L (ref 135–145)
Total Bilirubin: 0.8 mg/dL (ref 0.3–1.2)
Total Protein: 6.9 g/dL (ref 6.5–8.1)

## 2020-08-25 LAB — LIPASE, BLOOD: Lipase: 26 U/L (ref 11–51)

## 2020-08-25 MED ORDER — SODIUM CHLORIDE 0.9 % IV BOLUS
1000.0000 mL | Freq: Once | INTRAVENOUS | Status: AC
  Administered 2020-08-25: 1000 mL via INTRAVENOUS

## 2020-08-25 MED ORDER — ONDANSETRON 4 MG PO TBDP: 4.0000 mg | ORAL_TABLET | Freq: Three times a day (TID) | ORAL | 0 refills | Status: DC | PRN

## 2020-08-25 NOTE — ED Triage Notes (Signed)
Pt c/o emesis yesterday after working in the heat. Pt also reports all over muscle cramps and disorientation. Pt states he called EMS and they evaluated him and advised him to go to ED for care. Pt states he went to ED and it was a 20 hour wait so he left. Pt has been able to drink liquids today. Pt also reports diarrhea, dizziness and continued nausea.

## 2020-08-25 NOTE — ED Provider Notes (Addendum)
[facility]    CSN: [id number] Arrival date & time: 08/25/20  1301      History   Chief Complaint Chief Complaint  Patient presents with  . Generalized Body Aches  . Nausea   HPI   30 year old male presents with the above complaints.  Patient states that he got overheated at work yesterday.  Subsequently developed nausea, vomiting.  He has had diarrhea as well.  He states that he was experiencing abdominal cramping and felt very poorly.  He called EMS and they advised him to go to the ER.  He states that he went to the ER and was told that they had a 20-hour wait.  He thus went home and waited it out.  Decided to come in today for evaluation.  He is now tolerating liquids.  He has not eaten anything since yesterday.  He reports continued nausea and continued diarrhea.  Continues to feel very fatigued and weak.  No relieving factors.  No other complaints.   Patient Active Problem List   Diagnosis Date Noted  . Gastroesophageal reflux disease without esophagitis 09/19/2019  . Migraine without aura and without status migrainosus, not intractable 04/14/2019  . Low vitamin B12 level 03/19/2019  . Low testosterone 03/19/2019  . Current smoker 03/17/2019  . Essential hypertension 03/17/2019  . Lethargy 03/17/2019    Past Surgical History:  Procedure Laterality Date  . NO PAST SURGERIES         Home Medications    Prior to Admission medications   Medication Sig Start Date End Date Taking? Authorizing Provider  amLODipine (NORVASC) 10 MG tablet Take 10 mg by mouth daily. 04/14/19  Yes [provider]  cyanocobalamin 1000 MCG tablet Take 2 tablets daily for 2 weeks, then reduce to 1 tablet daily thereafter for Vitamin B12 Deficiency. 03/19/19  Yes [provider]  loratadine (CLARITIN) 10 MG tablet Take 10 mg by mouth daily. 04/14/19  Yes [provider]  losartan (COZAAR) 50 MG tablet TAKE 1/2 TABLET DAILY FOR 1 WEEK, THEN INCREASE TO 1 WHOLE  TABLET. 04/14/19  Yes [provider]  ondansetron (ZOFRAN ODT) 4 MG disintegrating tablet Take 1 tablet (4 mg total) by mouth every 8 (eight) hours as needed for nausea or vomiting. 08/25/20  Yes Delvis Kau G, DO  testosterone cypionate (DEPOTESTOSTERONE CYPIONATE) 200 MG/ML injection INJECT 1 ML (200 MG TOTAL) INTO THE MUSCLE EVERY 14 (FOURTEEN) DAYS. 05/17/20  Yes McGowan, Shannon A, PA-C  NEEDLE, DISP, 18 G (BD DISP NEEDLES) 18G X 1-1/2" MISC 1 mg by Does not apply route every 14 (fourteen) days. 01/05/20   McGowan, Carollee Herter A, PA-C  NEEDLE, DISP, 23 G 23G X 1" MISC 0.75 mLs by Does not apply route every 14 (fourteen) days. 01/05/20   Michiel Cowboy A, PA-C  Syringe, Disposable, (2-3CC SYRINGE) 3 ML MISC 1 mg by Does not apply route every 14 (fourteen) days. 01/05/20   Harle Battiest, PA-C    Family History Family History  Problem Relation Age of Onset  . Colon cancer Mother   . Hypertension Mother   . Thyroid disease Mother   . Diabetes Father   . Hypertension Father     Social History Social History   Tobacco Use  . Smoking status: Current Some Day Smoker    Packs/day: 0.50    Types: Cigarettes  . Smokeless tobacco: Former Clinical biochemist  . Vaping Use: Former  Substance Use Topics  . Alcohol use: Yes  Comment: occasional  . Drug use: Not Currently     Allergies   Patient has no known allergies.   Review of Systems Review of Systems Per HPI  Physical Exam Triage Vital Signs ED Triage Vitals  Enc Vitals Group     BP 08/25/20 1402 (!) 132/53     Pulse Rate 08/25/20 1402 95     Resp 08/25/20 1402 18     Temp 08/25/20 1402 98.2 F (36.8 C)     Temp Source 08/25/20 1402 Oral     SpO2 08/25/20 1402 99 %     Weight 08/25/20 1400 225 lb (102.1 kg)     Height 08/25/20 1400 6\' 4"  (1.93 m)     Head Circumference --      Peak Flow --      Pain Score 08/25/20 1359 7     Pain Loc --      Pain Edu? --      Excl. in GC? --    Updated Vital Signs BP (!)  132/53 (BP Location: Left Arm)   Pulse 95   Temp 98.2 F (36.8 C) (Oral)   Resp 18   Ht 6\' 4"  (1.93 m)   Wt 102.1 kg   SpO2 99%   BMI 27.39 kg/m   Visual Acuity Right Eye Distance:   Left Eye Distance:   Bilateral Distance:    Right Eye Near:   Left Eye Near:    Bilateral Near:     Physical Exam Constitutional:      Comments: Appears tired and fatigued.  No acute distress.  HENT:     Head: Normocephalic and atraumatic.  Eyes:     General:        Right eye: No discharge.        Left eye: No discharge.     Conjunctiva/sclera: Conjunctivae normal.  Cardiovascular:     Rate and Rhythm: Normal rate and regular rhythm.  Pulmonary:     Effort: Pulmonary effort is normal.     Breath sounds: Normal breath sounds. No wheezing, rhonchi or rales.  Abdominal:     General: There is no distension.     Palpations: Abdomen is soft.  Neurological:     Mental Status: He is alert.  Psychiatric:        Mood and Affect: Mood normal.        Behavior: Behavior normal.    UC Treatments / Results  Labs (all labs ordered are listed, but only abnormal results are displayed) Labs Reviewed  COMPREHENSIVE METABOLIC PANEL - Abnormal; Notable for the following components:      Result Value   Potassium 3.4 (*)    Calcium 8.6 (*)    All other components within normal limits  LIPASE, BLOOD  CBC WITH DIFFERENTIAL/PLATELET    EKG   Radiology No results found.  Procedures Procedures (including critical care time)  Medications Ordered in UC Medications  sodium chloride 0.9 % bolus 1,000 mL (1,000 mLs Intravenous Total Dose 08/25/20 1540)    Initial Impression / Assessment and Plan / UC Course  I have reviewed the triage vital signs and the nursing notes.  Pertinent labs & imaging results that were available during my care of the patient were reviewed by me and considered in my medical decision making (see chart for details).    30 year old male presents with gastroenteritis.   Patient clinically dehydrated.  IV fluids given.  Laboratory studies obtained and revealed mild hypokalemia of 3.4.  BUN/creatinine  normal.  Treating with Zofran.  Push fluids.  Supportive care.  Final Clinical Impressions(s) / UC Diagnoses   Final diagnoses:  Gastroenteritis  Dehydration     Discharge Instructions     Rest.  Fluids.  Medication as prescribed.  Take care  Dr. Adriana Simas     ED Prescriptions    Medication Sig Dispense Auth. Provider   ondansetron (ZOFRAN ODT) 4 MG disintegrating tablet Take 1 tablet (4 mg total) by mouth every 8 (eight) hours as needed for nausea or vomiting. 20 tablet Tommie Sams, DO     PDMP not reviewed this encounter.   Tommie Sams, DO 08/25/20 1552    Tommie Sams, DO 08/30/20 1439

## 2020-08-25 NOTE — Discharge Instructions (Addendum)
Rest. Fluids.  Medication as prescribed.   Take care  Dr. Corinn Stoltzfus  

## 2020-08-30 NOTE — Progress Notes (Signed)
I don't document anything regarding the bolus, its done by nursing staff. It populates in my note. Bolus usually takes 1 hour.

## 2020-09-04 NOTE — Progress Notes (Signed)
Please forward this to Rosalee Kaufman (she the nurse manager). I'm not sure who did the IV.  Everlene Other DO [facility]

## 2020-09-07 ENCOUNTER — Other Ambulatory Visit: Payer: Self-pay | Admitting: Urology

## 2020-09-07 DIAGNOSIS — E349 Endocrine disorder, unspecified: Secondary | ICD-10-CM

## 2020-11-12 ENCOUNTER — Other Ambulatory Visit: Payer: Self-pay

## 2020-11-22 ENCOUNTER — Ambulatory Visit: Payer: Self-pay | Admitting: Urology

## 2020-11-23 ENCOUNTER — Other Ambulatory Visit: Payer: Self-pay | Admitting: *Deleted

## 2020-11-23 ENCOUNTER — Other Ambulatory Visit: Payer: Managed Care, Other (non HMO)

## 2020-11-23 ENCOUNTER — Other Ambulatory Visit: Payer: Self-pay

## 2020-11-23 DIAGNOSIS — E349 Endocrine disorder, unspecified: Secondary | ICD-10-CM

## 2020-11-24 LAB — HEMOGLOBIN AND HEMATOCRIT, BLOOD
Hematocrit: 41.3 % (ref 37.5–51.0)
Hemoglobin: 14.6 g/dL (ref 13.0–17.7)

## 2020-11-24 LAB — TESTOSTERONE: Testosterone: 434 ng/dL (ref 264–916)

## 2020-11-26 NOTE — Progress Notes (Deleted)
11/29/2020 4:47 PM   Steve Nielsen [birth year] [id number]  Referring provider: Lorenso Quarry, NP [address] [place],  [place] [address]  Urological history: 1. Testosterone deficiency -contributing factors of former marijuana use - injecting testosterone cypionate 200mg /mL, 0.75 cc every two weeks - testosterone 89 ng/dL on - hemoglobin and HCT are WNL - still having spontaneous erections at night  2. ED -contributing factors of testosterone deficiency and smoking -SHIM ***  3. Nephrolithiasis - Small left renal stone found incidentally on CT scan 09/2019  No chief complaint on file.    HPI: Steve Nielsen is a 30 y.o. male who presents today for follow up.     Score:  1-7 Mild 8-19 Moderate 20-35 Severe     Score: 1-7 Severe ED 8-11 Moderate ED 12-16 Mild-Moderate ED 17-21 Mild ED 22-25 No ED        PMH: No past medical history on file.  Surgical History: Past Surgical History:  Procedure Laterality Date   NO PAST SURGERIES      Home Medications:  Allergies as of 11/29/2020   No Known Allergies      Medication List        Accurate as of November 26, 2020  4:47 PM. If you have any questions, ask your nurse or doctor.          2-3CC SYRINGE 3 ML Misc 1 mg by Does not apply route every 14 (fourteen) days.   amLODipine 10 MG tablet Commonly known as: NORVASC Take 10 mg by mouth daily.   BD Disp Needles 18G X 1-1/2" Misc Generic drug: NEEDLE (DISP) 18 G 1 mg by Does not apply route every 14 (fourteen) days.   cyanocobalamin 1000 MCG tablet Take 2 tablets daily for 2 weeks, then reduce to 1 tablet daily thereafter for Vitamin B12 Deficiency.   loratadine 10 MG tablet Commonly known as: CLARITIN Take 10 mg by mouth daily.   losartan 50 MG tablet Commonly known as: COZAAR TAKE 1/2 TABLET DAILY FOR 1 WEEK, THEN INCREASE TO 1 WHOLE TABLET.   NEEDLE (DISP) 23 G 23G X 1" Misc 0.75 mLs by Does not apply route  every 14 (fourteen) days.   ondansetron 4 MG disintegrating tablet Commonly known as: Zofran ODT Take 1 tablet (4 mg total) by mouth every 8 (eight) hours as needed for nausea or vomiting.   testosterone cypionate 200 MG/ML injection Commonly known as: DEPOTESTOSTERONE CYPIONATE INJECT 1 ML (200 MG TOTAL) INTO THE MUSCLE EVERY 14 (FOURTEEN) DAYS.        Allergies: No Known Allergies  Family History: Family History  Problem Relation Age of Onset   Colon cancer Mother    Hypertension Mother    Thyroid disease Mother    Diabetes Father    Hypertension Father     Social History:  reports that he has been smoking cigarettes. He has been smoking an average of .5 packs per day. He has quit using smokeless tobacco. He reports current alcohol use. He reports that he does not currently use drugs.  ROS: For pertinent review of systems please refer to history of present illness  Physical Exam: There were no vitals taken for this visit.  Constitutional:  Well nourished. Alert and oriented, No acute distress. HEENT:  AT, moist mucus membranes.  Trachea midline Cardiovascular: No clubbing, cyanosis, or edema. Respiratory: Normal respiratory effort, no increased work of breathing. GI: Abdomen is soft, non tender, non distended, no abdominal masses. Liver  and spleen not palpable.  No hernias appreciated.  Stool sample for occult testing is not indicated.   GU: No CVA tenderness.  No bladder fullness or masses.  Patient with circumcised/uncircumcised phallus. ***Foreskin easily retracted***  Urethral meatus is patent.  No penile discharge. No penile lesions or rashes. Scrotum without lesions, cysts, rashes and/or edema.  Testicles are located scrotally bilaterally. No masses are appreciated in the testicles. Left and right epididymis are normal. Rectal: Patient with  normal sphincter tone. Anus and perineum without scarring or rashes. No rectal masses are appreciated. Prostate is approximately  *** grams, *** nodules are appreciated. Seminal vesicles are normal. Skin: No rashes, bruises or suspicious lesions. Lymph: No inguinal adenopathy. Neurologic: Grossly intact, no focal deficits, moving all 4 extremities. Psychiatric: Normal mood and affect.   Laboratory Data: Results for orders placed or performed in visit on 11/23/20  Hemoglobin and hematocrit, blood  Result Value Ref Range   Hemoglobin 14.6 13.0 - 17.7 g/dL   Hematocrit 86.7 67.2 - 51.0 %  Testosterone  Result Value Ref Range   Testosterone 434 264 - 916 ng/dL    Component Ref Range & Units 3 mo ago 1 yr ago  Sodium 135 - 145 mmol/L 138    Potassium 3.5 - 5.1 mmol/L 3.4 Low     Chloride 98 - 111 mmol/L 103    CO2 22 - 32 mmol/L 28    Glucose, Bld 70 - 99 mg/dL 85    Comment: Glucose reference range applies only to samples taken after fasting for at least 8 hours.  BUN 6 - 20 mg/dL 17    Creatinine, Ser 0.94 - 1.24 mg/dL 7.09  6.28   Calcium 8.9 - 10.3 mg/dL 8.6 Low     Total Protein 6.5 - 8.1 g/dL 6.9    Albumin 3.5 - 5.0 g/dL 3.8    AST 15 - 41 U/L 17    ALT 0 - 44 U/L 17    Alkaline Phosphatase 38 - 126 U/L 54    Total Bilirubin 0.3 - 1.2 mg/dL 0.8    GFR, Estimated >36 mL/min >60    Comment: (NOTE)  Calculated using the CKD-EPI Creatinine Equation (2021)   Anion gap 5 - 15 7    Comment: Performed at [facility], [address], [place], [place] [address]  Resulting Agency  [facility]         Specimen Collected: 08/25/20 14:45 Last Resulted: 08/25/20 15:20      I have reviewed the labs.   Pertinent Imaging: No imaging since last visit    Assessment & Plan:   1. Testosterone deficiency - discussed changing treatment modalities as he is having issues with consistency with injections and obtaining needles, but he is not wanting to change treatment types at this time   2. Erectile dysfunction:    - no issues when consistent with injections   3. Nephrolithiasis -  will follow clinically   No follow-ups on file.  These notes generated with voice recognition software. I apologize for typographical errors.  Michiel Cowboy, PA-C  [facility] [address]  [address] [place], [place] [address] [phone]

## 2020-11-29 ENCOUNTER — Ambulatory Visit: Payer: Self-pay | Admitting: Urology

## 2020-11-29 DIAGNOSIS — E349 Endocrine disorder, unspecified: Secondary | ICD-10-CM

## 2020-11-29 DIAGNOSIS — N529 Male erectile dysfunction, unspecified: Secondary | ICD-10-CM

## 2020-11-29 DIAGNOSIS — N200 Calculus of kidney: Secondary | ICD-10-CM

## 2020-12-06 ENCOUNTER — Telehealth: Payer: Self-pay | Admitting: Urology

## 2020-12-06 NOTE — Telephone Encounter (Signed)
Pt called office to r/s his appt for 9/12.  He will run out of testosterone  by this weekend.  He wants to know if you can send any to [facility] in [place] to last until his appt.

## 2020-12-06 NOTE — Telephone Encounter (Signed)
Mailbox is full and could not leave message.will try again later

## 2020-12-06 NOTE — Telephone Encounter (Signed)
He was a "No Show" on 08/22 for his appointment.  Testosterone is a controlled substance, so we cannot refill medication until I see him on the 12th.

## 2020-12-07 NOTE — Telephone Encounter (Signed)
Spoke with patient and advised results, he will discuss at appt.

## 2020-12-09 ENCOUNTER — Other Ambulatory Visit: Payer: Self-pay | Admitting: Urology

## 2020-12-09 DIAGNOSIS — E349 Endocrine disorder, unspecified: Secondary | ICD-10-CM

## 2020-12-17 NOTE — Progress Notes (Signed)
12/20/2020 9:02 AM   Steve Nielsen 01/22/1991 [id number]  Referring provider: Lorenso Quarry, NP [address] [place],  [place] [address]  Urological history: 1. Testosterone deficiency -contributing factors of former marijuana use -injecting testosterone cypionate 200mg /mL, 0.75 cc every two weeks -testosterone 434 ng/dL in -hemoglobin and HCT are WNL in 11/2020  2. ED -contributing factors of testosterone deficiency and smoking -SHIM 25  3. 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 male who presents today for follow up.   He is having to strain on occasion mostly due to dehydration.  Patient denies any modifying or aggravating factors.  Patient denies any gross hematuria, dysuria or suprapubic/flank pain.  Patient denies any fevers, chills, nausea or vomiting.     IPSS     Row Name 12/20/20 0800         International Prostate Symptom Score   How often have you had the sensation of not emptying your bladder? Less than 1 in 5     How often have you had to urinate less than every two hours? Not at All     How often have you found you stopped and started again several times when you urinated? Less than half the time     How often have you found it difficult to postpone urination? Less than half the time     How often have you had a weak urinary stream? Less than half the time     How often have you had to strain to start urination? Not at All     How many times did you typically get up at night to urinate? 1 Time     Total IPSS Score 8           Quality of Life due to urinary symptoms   If you were to spend the rest of your life with your urinary condition just the way it is now how would you feel about that? Mostly Satisfied              Score:  1-7 Mild 8-19  Moderate 20-35 Severe    Patient still having spontaneous erections.  He denies any pain or curvature with erections.     SHIM     Row Name 12/20/20 0844         SHIM: Over the last 6 months:   How do you rate your confidence that you could get and keep an erection? Very High     When you had erections with sexual stimulation, how often were your erections hard enough for penetration (entering your partner)? Almost Always or Always     During sexual intercourse, how often were you able to maintain your erection after you had penetrated (entered) your partner? Almost Always or Always     During sexual intercourse, how difficult was it to maintain your erection to completion of intercourse? Not Difficult     When you attempted sexual intercourse, how often was it satisfactory for you? Almost Always or Always           SHIM Total Score   SHIM 25              Score: 1-7 Severe ED 8-11 Moderate ED 12-16 Mild-Moderate ED  17-21 Mild ED 22-25 No ED   PMH: No past medical history on file.  Surgical History: Past Surgical History:  Procedure Laterality Date   NO PAST SURGERIES      Home Medications:  Allergies as of 12/20/2020   No Known Allergies      Medication List        Accurate as of December 20, 2020  9:02 AM. If you have any questions, ask your nurse or doctor.          STOP taking these medications    ondansetron 4 MG disintegrating tablet Commonly known as: Zofran ODT Stopped by: Sherri Levenhagen, PA-C       TAKE these medications    2-3CC SYRINGE 3 ML Misc 1 mg by Does not apply route every 14 (fourteen) days.   amLODipine 10 MG tablet Commonly known as: NORVASC Take 10 mg by mouth daily.   BD Disp Needles 18G X 1-1/2" Misc Generic drug: NEEDLE (DISP) 18 G 1 mg by Does not apply route every 14 (fourteen) days.   cyanocobalamin 1000 MCG tablet Take 2 tablets daily for 2 weeks, then reduce to 1 tablet daily thereafter for Vitamin B12  Deficiency.   loratadine 10 MG tablet Commonly known as: CLARITIN Take 10 mg by mouth daily.   losartan 50 MG tablet Commonly known as: COZAAR TAKE 1/2 TABLET DAILY FOR 1 WEEK, THEN INCREASE TO 1 WHOLE TABLET.   NEEDLE (DISP) 23 G 23G X 1" Misc 0.75 mLs by Does not apply route every 14 (fourteen) days.   testosterone cypionate 200 MG/ML injection Commonly known as: DEPOTESTOSTERONE CYPIONATE Inject 0.75 cc every 14 days What changed: See the new instructions. Changed by: Michiel Cowboy, PA-C        Allergies: No Known Allergies  Family History: Family History  Problem Relation Age of Onset   Colon cancer Mother    Hypertension Mother    Thyroid disease Mother    Diabetes Father    Hypertension Father     Social History:  reports that he has been smoking cigarettes. He has been smoking an average of .5 packs per day. He has quit using smokeless tobacco. He reports current alcohol use. He reports that he does not currently use drugs.  ROS: For pertinent review of systems please refer to history of present illness  Physical Exam: BP 124/80   Pulse 96   Ht 6\' 3"  (1.905 m)   Wt 226 lb (102.5 kg)   BMI 28.25 kg/m   Constitutional:  Well nourished. Alert and oriented, No acute distress. HEENT: [place] AT, mask in place.  Trachea midline Cardiovascular: No clubbing, cyanosis, or edema. Respiratory: Normal respiratory effort, no increased work of breathing. Neurologic: Grossly intact, no focal deficits, moving all 4 extremities. Psychiatric: Normal mood and affect.   Laboratory Data: Results for orders placed or performed in visit on 11/23/20  Hemoglobin and hematocrit, blood  Result Value Ref Range   Hemoglobin 14.6 13.0 - 17.7 g/dL   Hematocrit 11/25/20 08.6 - 51.0 %  Testosterone  Result Value Ref Range   Testosterone 434 264 - 916 ng/dL    Component Ref Range & Units 3 mo ago 1 yr ago  Sodium 135 - 145 mmol/L 138    Potassium 3.5 - 5.1 mmol/L 3.4 Low     Chloride  98 - 111 mmol/L 103    CO2 22 - 32 mmol/L 28    Glucose, Bld 70 - 99 mg/dL 85  Comment: Glucose reference range applies only to samples taken after fasting for at least 8 hours.  BUN 6 - 20 mg/dL 17    Creatinine, Ser 0.97 - 1.24 mg/dL 3.53  2.99   Calcium 8.9 - 10.3 mg/dL 8.6 Low     Total Protein 6.5 - 8.1 g/dL 6.9    Albumin 3.5 - 5.0 g/dL 3.8    AST 15 - 41 U/L 17    ALT 0 - 44 U/L 17    Alkaline Phosphatase 38 - 126 U/L 54    Total Bilirubin 0.3 - 1.2 mg/dL 0.8    GFR, Estimated >24 mL/min >60    Comment: (NOTE)  Calculated using the CKD-EPI Creatinine Equation (2021)   Anion gap 5 - 15 7    Comment: Performed at [facility], [address] [place], [place] [address]  Resulting Agency  [facility]         Specimen Collected: 08/25/20 14:45 Last Resulted: 08/25/20 15:20      I have reviewed the labs.   Pertinent Imaging: No imaging since last visit    Assessment & Plan:   1. Testosterone deficiency -discussed the importance of keeping his appointments for labs/follow up as testosterone therapy needs to be monitored on a regular basis and it is a controlled substance -continue testosterone cypionate 200MG /ML, 0.75 cc every 14 days-refills sent to pharmacy  2. Erectile dysfunction:    - no issues when consistent with injections   3. Nephrolithiasis -will follow clinically  4. LU TS  -occurs when dehydrated -encouraged to consume more water at work to prevent dehydration    Return in about 6 months (around 06/19/2021) for Testosterone (one week after injection) H & H, IPSS, SHIM and exam.  These notes generated with voice recognition software. I apologize for typographical errors.  08/19/2021, PA-C  [facility] [address]  [address] [place], [place] [phone]

## 2020-12-20 ENCOUNTER — Other Ambulatory Visit: Payer: Self-pay

## 2020-12-20 ENCOUNTER — Ambulatory Visit (INDEPENDENT_AMBULATORY_CARE_PROVIDER_SITE_OTHER): Payer: Managed Care, Other (non HMO) | Admitting: Urology

## 2020-12-20 ENCOUNTER — Encounter: Payer: Self-pay | Admitting: Urology

## 2020-12-20 VITALS — BP 124/80 | HR 96 | Ht 75.0 in | Wt 226.0 lb

## 2020-12-20 DIAGNOSIS — N529 Male erectile dysfunction, unspecified: Secondary | ICD-10-CM

## 2020-12-20 DIAGNOSIS — N200 Calculus of kidney: Secondary | ICD-10-CM | POA: Diagnosis not present

## 2020-12-20 DIAGNOSIS — R399 Unspecified symptoms and signs involving the genitourinary system: Secondary | ICD-10-CM

## 2020-12-20 DIAGNOSIS — E349 Endocrine disorder, unspecified: Secondary | ICD-10-CM

## 2020-12-20 MED ORDER — TESTOSTERONE CYPIONATE 200 MG/ML IM SOLN: INTRAMUSCULAR | 0 refills | Status: DC

## 2021-02-07 IMAGING — MR MR HEAD WO/W CM
12 of 18 series · 27 of 48 positions shown · IV contrast (gadavist)
Comparison: None.

CLINICAL DATA: 28-year-old male with persistent headaches for 2-3
months. No improvement despite treatment for hypertension. B12
deficiency and decreased testosterone on lab analysis.

EXAM:
MRI HEAD WITHOUT AND WITH CONTRAST
TECHNIQUE: Multiplanar, multiecho pulse sequences of the brain and surrounding
structures were obtained without and with intravenous contrast.
CONTRAST:  10mL GADAVIST GADOBUTROL 1 MMOL/ML IV SOLN

[Series 7: T1 · sagittal · 5.0mm · 0.62mm/px · 2 of 23 slices shown]
[im 1/23]
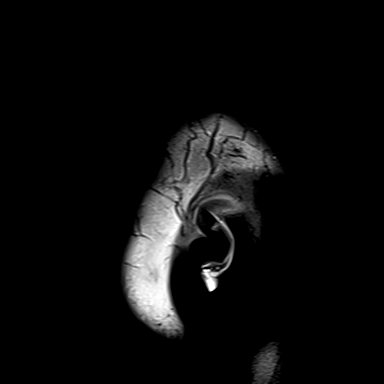
[im 23/23]
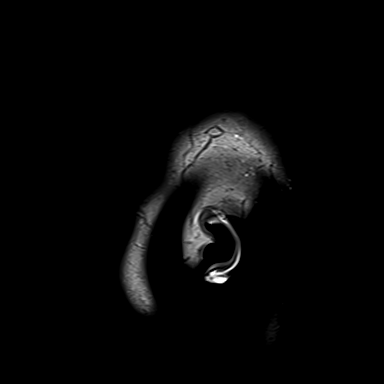

[Series 8: T2 · axial · 5.0mm · 0.53mm/px · z∈[-22,+132]mm · 2 of 27 slices shown]
[im 1/27]
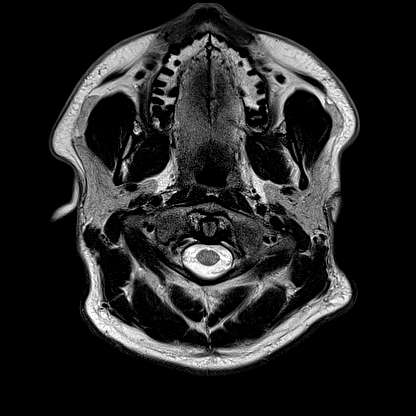
[im 27/27]
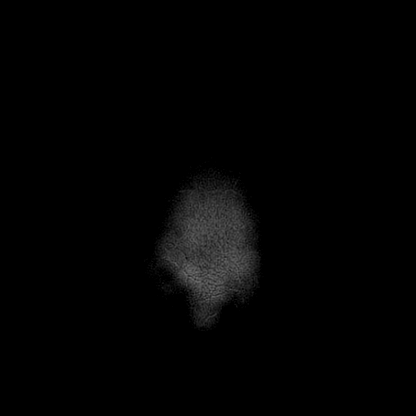

[Series 10: FLAIR · axial · 3.0mm · 0.53mm/px · z∈[-25,+135]mm · 5 of 55 slices shown]
[im 1/55]
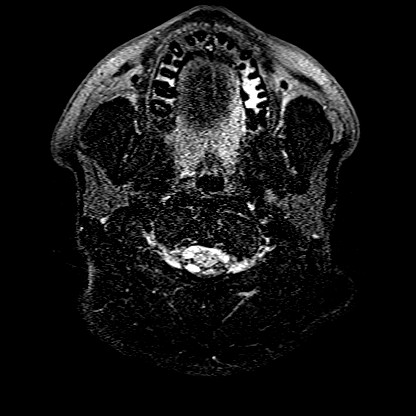
[im 14/55]
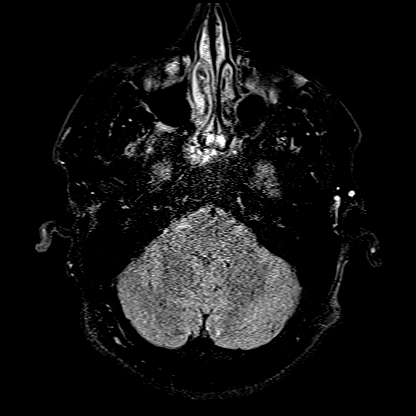
[im 28/55]
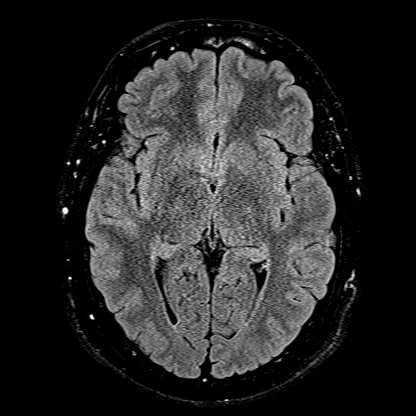
[im 41/55]
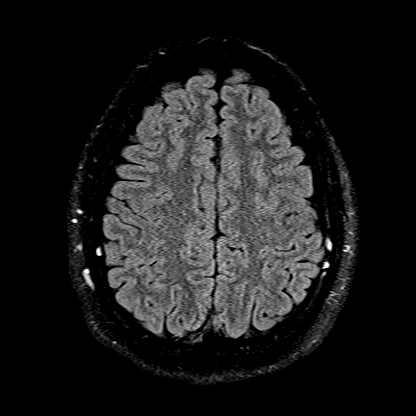
[im 55/55]
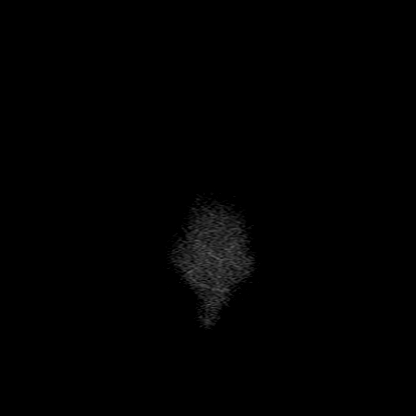

[Series 14: T1 post-contrast · coronal · 3.0mm · 0.28mm/px · 1 of 11 slices shown (1 of 9)]
[im 1/11]
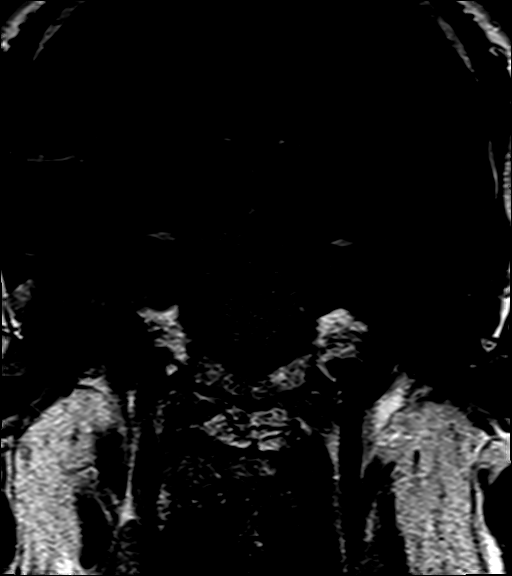

[Series 15: T1 post-contrast · coronal · 3.0mm · 0.28mm/px · 1 of 11 slices shown (2 of 9)]
[im 1/11]
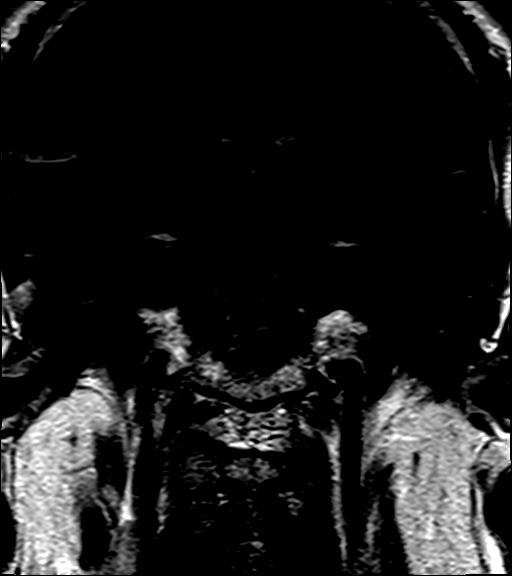

[Series 16: T1 post-contrast · coronal · 3.0mm · 0.28mm/px · 1 of 11 slices shown (3 of 9)]
[im 1/11]
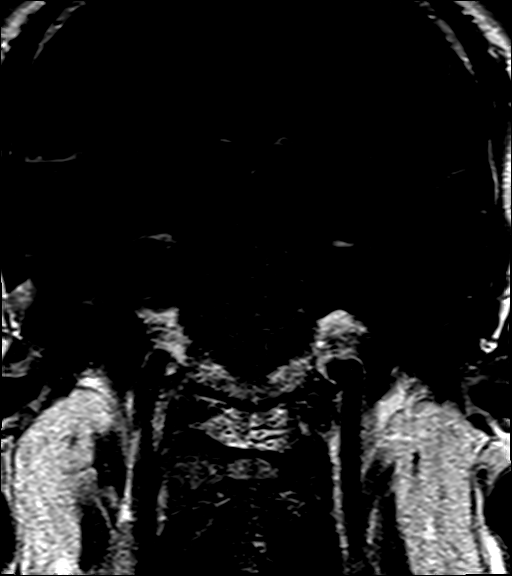

[Series 17: T1 post-contrast · coronal · 3.0mm · 0.28mm/px · 1 of 11 slices shown (4 of 9)]
[im 1/11]
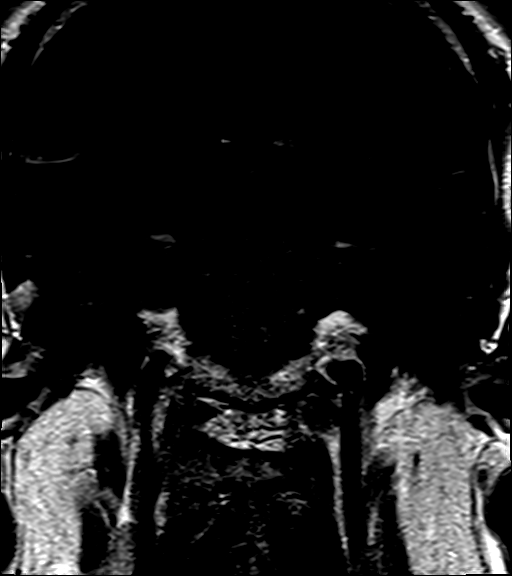

[Series 18: T1 post-contrast · coronal · 3.0mm · 0.28mm/px · 1 of 11 slices shown (5 of 9)]
[im 1/11]
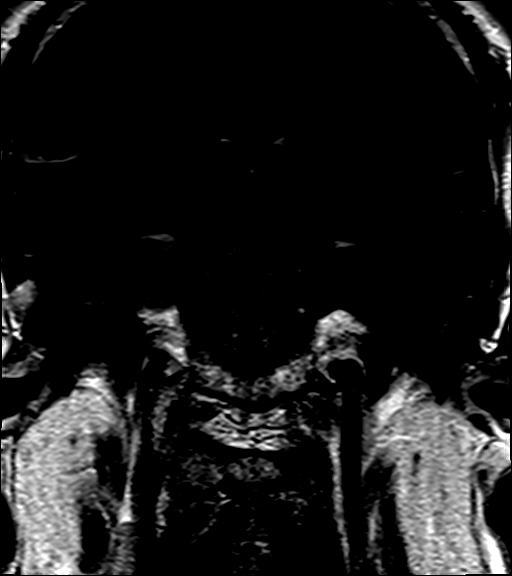

[Series 19: T1 post-contrast · coronal · 3.0mm · 0.21mm/px · 1 of 13 slices shown (6 of 9)]
[im 1/13]
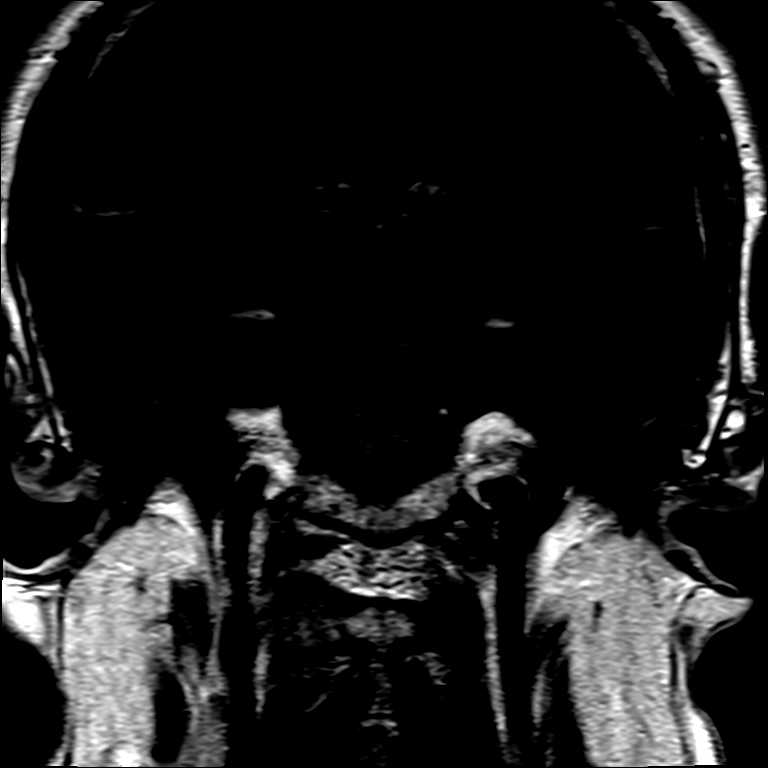

[Series 20: T1 post-contrast · sagittal · 3.0mm · 0.21mm/px · 1 of 13 slices shown (7 of 9)]
[im 1/13]
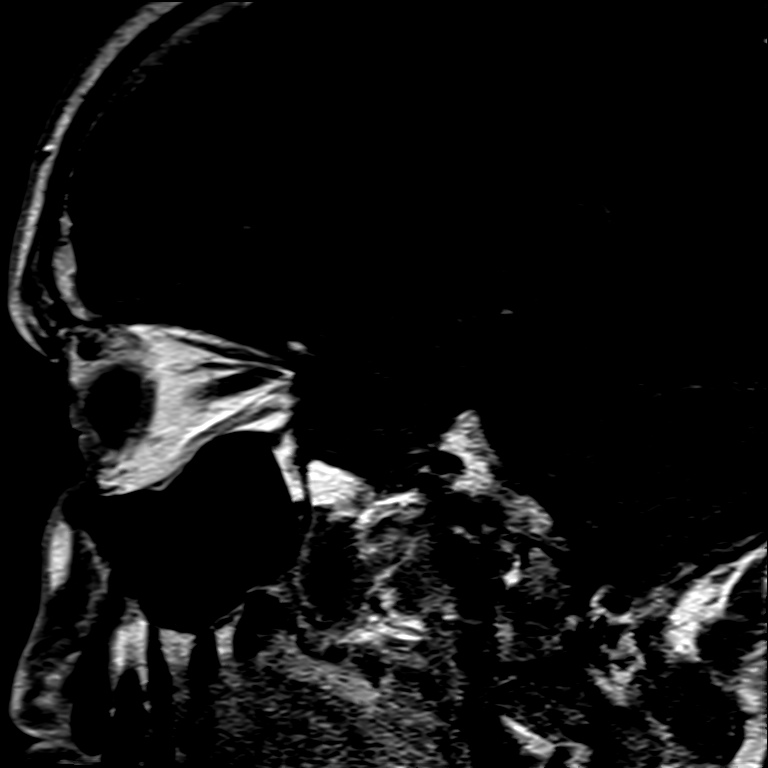

[Series 21: T1 post-contrast · axial · 1.0mm · 0.98mm/px · z∈[-23,+150]mm · 8 of 175 slices shown (8 of 9)]
[im 1/175]
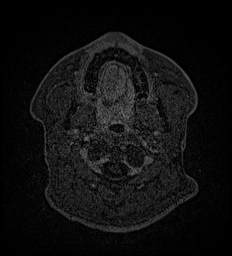
[im 25/175]
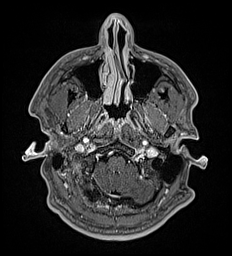
[im 50/175]
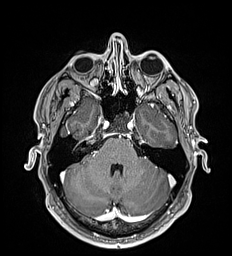
[im 75/175]
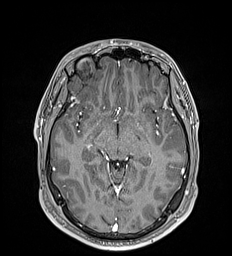
[im 100/175]
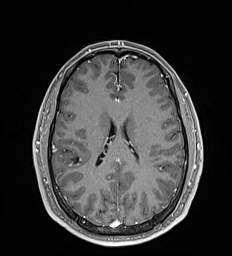
[im 125/175]
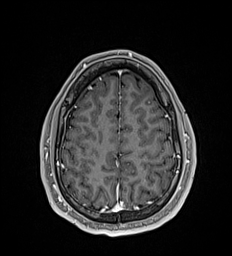
[im 150/175]
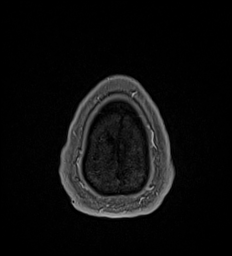
[im 175/175]
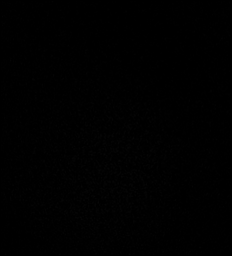

[Series 22: T1 post-contrast · coronal · 5.0mm · 0.57mm/px · 3 of 31 slices shown (9 of 9)]
[im 1/31]
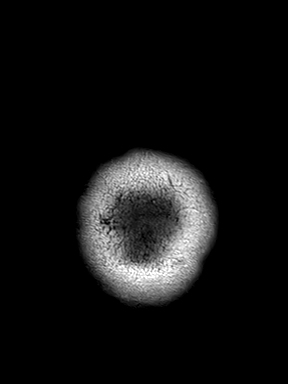
[im 16/31]
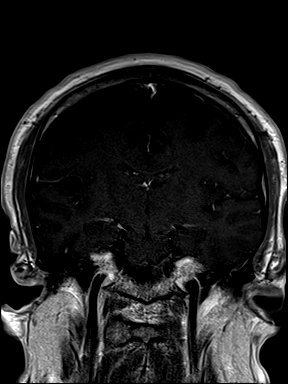
[im 31/31]
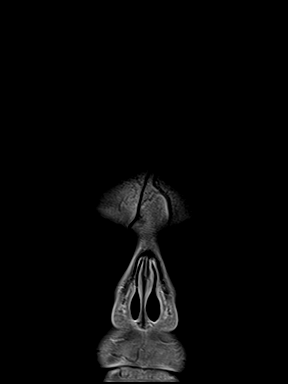

[27 of 48 positions shown; findings below may reference images not displayed]

FINDINGS: Brain: Pituitary findings are below.

Normal cerebral volume. No restricted diffusion to suggest acute
infarction. No midline shift, mass effect, evidence of mass lesion,
ventriculomegaly, extra-axial collection or acute intracranial
hemorrhage. Cervicomedullary junction within normal limits. Gray and
white matter signal is within normal limits throughout the brain. No
encephalomalacia or chronic cerebral blood products identified.

No abnormal gray or white matter enhancement identified. Small
developmental venous anomaly in the anterior right frontal lobe
(series 21, image 90), normal variant. No dural thickening.

Vascular: Major intracranial vascular flow voids are preserved.

Skull and upper cervical spine: Normal visible cervical spine.
Normal bone marrow signal.

Sinuses/Orbits: Negative orbits. Paranasal sinuses and mastoids are
well pneumatized. Visible internal auditory structures appear
normal. Scalp and face soft tissues appear negative.

Other: Dedicated pituitary imaging. Overall normal pituitary size
and configuration (series 7, image 12). Suprasellar cistern and
infundibulum appear normal. Normal hypothalamus. Cavernous sinuses
are within normal limits.

Dynamic and delayed pituitary enhancement is within normal limits.
No pituitary mass or lesion identified.
IMPRESSION: Normal MRI appearance of the brain and pituitary. No explanation for
headaches or endocrinopathy.

## 2021-05-08 ENCOUNTER — Ambulatory Visit
Admission: EM | Admit: 2021-05-08 | Discharge: 2021-05-08 | Disposition: A | Discharge status: Home / Self Care | Payer: BC Managed Care – PPO | Attending: Emergency Medicine | Admitting: Emergency Medicine

## 2021-05-08 ENCOUNTER — Other Ambulatory Visit: Payer: Self-pay

## 2021-05-08 ENCOUNTER — Ambulatory Visit (INDEPENDENT_AMBULATORY_CARE_PROVIDER_SITE_OTHER): Payer: BC Managed Care – PPO

## 2021-05-08 DIAGNOSIS — R109 Unspecified abdominal pain: Secondary | ICD-10-CM | POA: Diagnosis not present

## 2021-05-08 DIAGNOSIS — M545 Low back pain, unspecified: Secondary | ICD-10-CM | POA: Insufficient documentation

## 2021-05-08 DIAGNOSIS — R34 Anuria and oliguria: Secondary | ICD-10-CM | POA: Insufficient documentation

## 2021-05-08 DIAGNOSIS — R350 Frequency of micturition: Secondary | ICD-10-CM | POA: Insufficient documentation

## 2021-05-08 HISTORY — DX: Allergy, unspecified, initial encounter: T78.40XA

## 2021-05-08 HISTORY — DX: Essential (primary) hypertension: I10

## 2021-05-08 LAB — URINALYSIS, ROUTINE W REFLEX MICROSCOPIC
Bilirubin Urine: NEGATIVE
Glucose, UA: NEGATIVE mg/dL
Hgb urine dipstick: NEGATIVE
Ketones, ur: NEGATIVE mg/dL
Leukocytes,Ua: NEGATIVE
Nitrite: NEGATIVE
Protein, ur: NEGATIVE mg/dL
Specific Gravity, Urine: 1.025 (ref 1.005–1.030)
pH: 6 (ref 5.0–8.0)

## 2021-05-08 MED ORDER — KETOROLAC TROMETHAMINE 60 MG/2ML IM SOLN: 60.0000 mg | Freq: Once | INTRAMUSCULAR | Status: DC

## 2021-05-08 MED ORDER — KETOROLAC TROMETHAMINE 60 MG/2ML IM SOLN
60.0000 mg | Freq: Once | INTRAMUSCULAR | Status: DC
  Administered 2021-05-08: 60 mg via INTRAMUSCULAR

## 2021-05-08 MED ORDER — CYCLOBENZAPRINE HCL 10 MG PO TABS: 10.0000 mg | ORAL_TABLET | Freq: Three times a day (TID) | ORAL | 0 refills | Status: DC | PRN

## 2021-05-08 NOTE — Discharge Instructions (Signed)
You were given a shot of Toradol today to help with pain. May also try Flexeril 10mg  muscle relaxer- take 1 every 8 hours as needed- can cause drowsiness. If urine output continues to be low and pain continues, go to the ER ASAP. Otherwise, follow-up with a Urologist and PCP for further evaluation.

## 2021-05-08 NOTE — ED Triage Notes (Signed)
Patient is here for "Lower (back) Pain". Started "a few wks ago". Progressively worse "on/off". Episodes are becoming more often and longer. Left Lower back/flank. Recurrent "episodes of brown urine". ? Kidney stones or UTI. No dysuria. Urinary urgency & frequency. Last void "2 secs ago, with very little urine", last void prior "yesterday". No fever.

## 2021-05-09 ENCOUNTER — Other Ambulatory Visit: Payer: Self-pay | Admitting: *Deleted

## 2021-05-09 ENCOUNTER — Encounter: Payer: Self-pay | Admitting: Urology

## 2021-05-09 ENCOUNTER — Other Ambulatory Visit: Payer: Self-pay | Admitting: Urology

## 2021-05-09 ENCOUNTER — Ambulatory Visit
Admission: RE | Admit: 2021-05-09 | Discharge: 2021-05-09 | Disposition: A | Discharge status: Home / Self Care | Payer: BC Managed Care – PPO | Source: Ambulatory Visit | Attending: Urology | Admitting: Urology

## 2021-05-09 ENCOUNTER — Ambulatory Visit (INDEPENDENT_AMBULATORY_CARE_PROVIDER_SITE_OTHER): Payer: BC Managed Care – PPO | Admitting: Urology

## 2021-05-09 ENCOUNTER — Other Ambulatory Visit
Admission: RE | Admit: 2021-05-09 | Discharge: 2021-05-09 | Disposition: A | Discharge status: Home / Self Care | Payer: BC Managed Care – PPO | Attending: Urology | Admitting: Urology

## 2021-05-09 VITALS — BP 121/86 | HR 101 | Ht 75.0 in | Wt 220.0 lb

## 2021-05-09 DIAGNOSIS — R109 Unspecified abdominal pain: Secondary | ICD-10-CM | POA: Diagnosis not present

## 2021-05-09 DIAGNOSIS — R310 Gross hematuria: Secondary | ICD-10-CM

## 2021-05-09 DIAGNOSIS — R399 Unspecified symptoms and signs involving the genitourinary system: Secondary | ICD-10-CM

## 2021-05-09 LAB — URINALYSIS, COMPLETE (UACMP) WITH MICROSCOPIC
Glucose, UA: NEGATIVE mg/dL
Ketones, ur: NEGATIVE mg/dL
Leukocytes,Ua: NEGATIVE
Nitrite: NEGATIVE
Protein, ur: NEGATIVE mg/dL
Specific Gravity, Urine: >1.03 — ABNORMAL HIGH (ref 1.005–1.030)
pH: 5.5 (ref 5.0–8.0)

## 2021-05-09 NOTE — Progress Notes (Signed)
05/09/2021 4:54 PM   Steve Nielsen Steve Nielsen 04/16/1990 [id number]  Referring provider: Luciana Axe, NP [address] [place],  [place] [address]  Chief Complaint  Patient presents with   Follow-up   Nephrolithiasis    Urological history: 1. Testosterone deficiency -contributing factors of former marijuana use -testosterone 434 11/2020 -hemoglobin and HCT are WNL in 11/2020 -injecting testosterone cypionate 200mg /mL, 0.75 cc every two weeks  2. ED -contributing factors of testosterone deficiency and smoking  3. Nephrolithiasis - Small left renal stone found incidentally on CT scan 09/2019  4. LU TS   Chief Complaint  Patient presents with   Follow-up   Nephrolithiasis      HPI: Steve Nielsen is a 31 y.o. male who presents today for follow-up after being seen at the urgent care for back pain.   He was noted to [facility] urgent care yesterday complaining of lower back pain that started a few weeks ago that was occurring intermittently with becoming more persistent and lasting longer.  The pain is located in the lower left back in the flank area.  He had also been experiencing episodes of brown urine.  He is experiencing urinary urgency and frequency.    He was only triaged yesterday and did not see a provider.  KUB and urine dip from yesterday were negative.  He was prescribed Flexeril 10 mg from the urgent care.  Current NSAID/anticoagulation: BCG's, Advil    He states for the last two months, he has been having on and off pain in the left lower back.  At first, the pain was aggravated by walking around and bending.  And the pain was relieved by laying down.   Over the past few days, the pain has now become consistent and he cannot relieve the pain.   He has been taking over-the-counter NSAIDs and effort to relieve the pain and started to have gross hematuria.   The pain also radiates down into his left buttocks.  When I asked him to bend over and touch his toes,  he was only able to bend a few inches before the pain set in.    UA negative    STAT CT renal stone study - NED  PMH: Past Medical History:  Diagnosis Date   Allergies    Hypertension     Surgical History: Past Surgical History:  Procedure Laterality Date   NO PAST SURGERIES      Home Medications:  Allergies as of 05/09/2021   No Known Allergies      Medication List        Accurate as of May 09, 2021 11:59 PM. If you have any questions, ask your nurse or doctor.          2-3CC SYRINGE 3 ML Misc 1 mg by Does not apply route every 14 (fourteen) days.   amLODipine 10 MG tablet Commonly known as: NORVASC Take 10 mg by mouth daily.   BD Disp Needles 18G X 1-1/2" Misc Generic drug: NEEDLE (DISP) 18 G 1 mg by Does not apply route every 14 (fourteen) days.   cyanocobalamin 1000 MCG tablet Take 2 tablets daily for 2 weeks, then reduce to 1 tablet daily thereafter for Vitamin B12 Deficiency.   cyclobenzaprine 10 MG tablet Commonly known as: FLEXERIL Take 1 tablet (10 mg total) by mouth every 8 (eight) hours as needed for muscle spasms.   loratadine 10 MG tablet Commonly known as: CLARITIN Take 10 mg by mouth daily.  losartan 50 MG tablet Commonly known as: COZAAR TAKE 1/2 TABLET DAILY FOR 1 WEEK, THEN INCREASE TO 1 WHOLE TABLET.   NEEDLE (DISP) 23 G 23G X 1" Misc 0.75 mLs by Does not apply route every 14 (fourteen) days.   testosterone cypionate 200 MG/ML injection Commonly known as: DEPOTESTOSTERONE CYPIONATE Inject 0.75 cc every 14 days        Allergies: No Known Allergies  Family History: Family History  Problem Relation Age of Onset   Colon cancer Mother    Hypertension Mother    Thyroid disease Mother    Diabetes Father    Hypertension Father     Social History:  reports that he has been smoking cigarettes. He has been smoking an average of .5 packs per day. He has quit using smokeless tobacco. He reports current alcohol use. He  reports that he does not currently use drugs.  ROS: For pertinent review of systems please refer to history of present illness  Physical Exam: BP 121/86    Pulse (!) 101    Ht 6\' 3"  (1.905 m)    Wt 220 lb (99.8 kg)    BMI 27.50 kg/m   Constitutional:  Well nourished. Alert and oriented, No acute distress. HEENT: [place] AT, mask in place.  Trachea midline Cardiovascular: No clubbing, cyanosis, or edema. Respiratory: Normal respiratory effort, no increased work of breathing. Neurologic: Grossly intact, no focal deficits, moving all 4 extremities. Psychiatric: Normal mood and affect.   Laboratory Data: Urinalysis Component     Latest Ref Rng & Units 05/09/2021  Color, Urine     YELLOW AMBER (A)  Appearance     CLEAR CLEAR  Specific Gravity, Urine     1.005 - 1.030 >1.030 (H)  pH     5.0 - 8.0 5.5  Glucose, UA     NEGATIVE mg/dL NEGATIVE  Hgb urine dipstick     NEGATIVE TRACE (A)  Bilirubin Urine     NEGATIVE SMALL (A)  Ketones, ur     NEGATIVE mg/dL NEGATIVE  Protein     NEGATIVE mg/dL NEGATIVE  Nitrite     NEGATIVE NEGATIVE  Leukocytes,Ua     NEGATIVE NEGATIVE  Squamous Epithelial / LPF     0 - 5 0-5  WBC, UA     0 - 5 WBC/hpf 0-5  RBC / HPF     0 - 5 RBC/hpf 0-5  Bacteria, UA     NONE SEEN FEW (A)  I have reviewed the labs.   Pertinent Imaging: CLINICAL DATA:  Back pain, left flank pain   EXAM: ABDOMEN - 1 VIEW   COMPARISON:  10/02/2019   FINDINGS: Bowel gas pattern is nonspecific. There is small stool burden in the colon. There is no fecal impaction in the rectosigmoid. No abnormal masses or calcifications are seen. Kidneys are partly obscured by bowel contents. Visualized lower lung fields and bony structures are unremarkable.   IMPRESSION: No radiographic abnormality is seen in the abdomen.     Electronically Signed   By: Elmer Picker M.D.   On: 05/08/2021 16:45  CLINICAL DATA:  Gross hematuria.   EXAM: CT ABDOMEN AND PELVIS WITHOUT  CONTRAST   TECHNIQUE: Multidetector CT imaging of the abdomen and pelvis was performed following the standard protocol without IV contrast.   RADIATION DOSE REDUCTION: This exam was performed according to the departmental dose-optimization program which includes automated exposure control, adjustment of the mA and/or kV according to patient size and/or use of iterative  reconstruction technique.   COMPARISON:  10/02/2019   FINDINGS: Lower chest: No acute abnormality.   Hepatobiliary: No focal liver abnormality is seen. No gallstones, gallbladder wall thickening, or biliary dilatation.   Pancreas: Unremarkable. No pancreatic ductal dilatation or surrounding inflammatory changes.   Spleen: Normal in size without focal abnormality.   Adrenals/Urinary Tract: Normal adrenal glands.   Stone within the upper pole collecting system of the left kidney measures 3 mm, image 30/2. No additional kidney stones identified bilaterally. No hydronephrosis or mass identified. Urinary bladder is unremarkable.   Stomach/Bowel: Stomach appears normal. The appendix is visualized and is within normal limits. No bowel wall thickening, inflammation, or distension.   Vascular/Lymphatic: No significant vascular findings are present. No enlarged abdominal or pelvic lymph nodes.   Reproductive: Prostate is unremarkable.   Other: No ascites or focal fluid collections.   Musculoskeletal: No acute or significant osseous findings.   IMPRESSION: 1. No acute findings within the abdomen or pelvis. 2. Nonobstructing left renal calculus.     Electronically Signed   By: Kerby Moors M.D.   On: 05/09/2021 16:49  I have independently reviewed the films.  See HPI.     Assessment & Plan:   1. Left flank pain -no stone seen on yesterday's KUB -CT 2021 - small non-obstructive left renal calculus  -CT 2023 - NED and UA is negative  -likely MSK in nature - advised to follow up with PCP  2. Gross  hematuria -Patient's history is more aligned with a muscle skeletal disorder versus nephrolithiasis, but with episodes of gross hematuria, which may have been precipitated by the excessive NSAID use, we will need to pursue a CT urogram at this time -patient could not tolerate laying recumbent long enough to complete the CT urogram    Return for schedule cysto for gross heme .  These notes generated with voice recognition software. I apologize for typographical errors.  Zara Council, PA-C  [facility] [address]  [place], [place] [address] [phone]

## 2021-05-09 NOTE — ED Provider Notes (Signed)
[facility]    CSN: [id number] Arrival date & time: 05/08/21  1437      History   Chief Complaint Chief Complaint  Patient presents with   Back Pain    LLQ    HPI Steve Nielsen is a 31 y.o. male.   31 year old male accompanied by his significant other with concern over left sided lower back pain that is getting worse over the past few days. Has been experiencing left sided back pain for a few months intermittently and had noticed increase in pain with certain movements. Works as a Engineer, manufacturing systems 7 days a week. Continues to emphasize that he has broken bones in the past and other injuries and the pain has never been this severe. A few days ago noticed his urine was "brown" and his significant other indicated that he probably has blood in his urine and may have a kidney stone and needs to be seen. He indicates the pain has gotten worse over the past 2 days. He denies any fever, distinct dysuria or penile discharge. Urine is not as dark today. Did drink 3 beers last night and has had minimal urine output and concerned over possible kidney stone. Has taken Ibuprofen with no relief. Has history of HTN, migraine headaches, GERD, low testosterone, and tobacco abuse. Denies any current illicit drug use. Currently on Norvasc, Losartan, Claritin and vit B12 daily and Testosterone bi monthly. Does have a Dealer but has not contacted them about his symptoms. No contributory family history.   The history is provided by the patient and a significant other.   Past Medical History:  Diagnosis Date   Allergies    Hypertension     Patient Active Problem List   Diagnosis Date Noted   Gastroesophageal reflux disease without esophagitis 09/19/2019   Migraine without aura and without status migrainosus, not intractable 04/14/2019   Low vitamin B12 level 03/19/2019   Low testosterone 03/19/2019   Current smoker 03/17/2019   Essential hypertension 03/17/2019   Lethargy 03/17/2019     Past Surgical History:  Procedure Laterality Date   NO PAST SURGERIES         Home Medications    Prior to Admission medications   Medication Sig Start Date End Date Taking? Authorizing Provider  amLODipine (NORVASC) 10 MG tablet Take 10 mg by mouth daily. 04/14/19  Yes [provider]  cyanocobalamin 1000 MCG tablet Take 2 tablets daily for 2 weeks, then reduce to 1 tablet daily thereafter for Vitamin B12 Deficiency. 03/19/19  Yes [provider]  cyclobenzaprine (FLEXERIL) 10 MG tablet Take 1 tablet (10 mg total) by mouth every 8 (eight) hours as needed for muscle spasms. 05/08/21  Yes Zebedee Segundo, Nicholes Stairs, NP  loratadine (CLARITIN) 10 MG tablet Take 10 mg by mouth daily. 04/14/19  Yes [provider]  losartan (COZAAR) 50 MG tablet TAKE 1/2 TABLET DAILY FOR 1 WEEK, THEN INCREASE TO 1 WHOLE TABLET. 04/14/19  Yes [provider]  testosterone cypionate (DEPOTESTOSTERONE CYPIONATE) 200 MG/ML injection Inject 0.75 cc every 14 days 12/20/20  Yes McGowan, Larene Beach A, PA-C  NEEDLE, DISP, 18 G (BD DISP NEEDLES) 18G X 1-1/2" MISC 1 mg by Does not apply route every 14 (fourteen) days. 01/05/20   McGowan, Larene Beach A, PA-C  NEEDLE, DISP, 23 G 23G X 1" MISC 0.75 mLs by Does not apply route every 14 (fourteen) days. 01/05/20   Nori Riis, PA-C  Syringe, Disposable, (2-3CC SYRINGE) 3 ML MISC 1  mg by Does not apply route every 14 (fourteen) days. 01/05/20   Nori Riis, PA-C    Family History Family History  Problem Relation Age of Onset   Colon cancer Mother    Hypertension Mother    Thyroid disease Mother    Diabetes Father    Hypertension Father     Social History Social History   Tobacco Use   Smoking status: Some Days    Packs/day: 0.50    Types: Cigarettes   Smokeless tobacco: Former  Scientific laboratory technician Use: Every day  Substance Use Topics   Alcohol use: Yes    Comment: occasional   Drug use: Not Currently     Allergies   Patient has  no known allergies.   Review of Systems Review of Systems  Constitutional:  Positive for fatigue. Negative for appetite change, chills, diaphoresis and fever.  Respiratory:  Negative for cough, chest tightness, shortness of breath and wheezing.   Cardiovascular:  Negative for chest pain and palpitations.  Gastrointestinal:  Negative for abdominal pain, constipation, diarrhea, nausea and vomiting.  Genitourinary:  Positive for decreased urine volume, difficulty urinating, flank pain and frequency (but decrease in amount). Negative for dysuria, genital sores, penile discharge, penile pain, penile swelling, scrotal swelling and testicular pain. Hematuria: uncertain- no frank red blood seen. Musculoskeletal:  Positive for arthralgias, back pain and myalgias. Negative for gait problem, neck pain and neck stiffness.  Skin:  Negative for color change and rash.  Allergic/Immunologic: Negative for environmental allergies, food allergies and immunocompromised state.  Neurological:  Positive for headaches (chronic migraine). Negative for dizziness, tremors, seizures, syncope, speech difficulty, weakness and numbness.  Hematological:  Negative for adenopathy. Does not bruise/bleed easily.    Physical Exam Triage Vital Signs ED Triage Vitals  Enc Vitals Group     BP 05/08/21 1505 140/90     Pulse Rate 05/08/21 1505 86     Resp 05/08/21 1505 20     Temp 05/08/21 1505 98.1 F (36.7 C)     Temp Source 05/08/21 1505 Oral     SpO2 05/08/21 1505 100 %     Weight 05/08/21 1507 225 lb 15.5 oz (102.5 kg)     Height 05/08/21 1507 6\' 3"  (1.905 m)     Head Circumference --      Peak Flow --      Pain Score 05/08/21 1504 6     Pain Loc --      Pain Edu? --      Excl. in [place]? --    No data found.  Updated Vital Signs BP 140/90 (BP Location: Left Arm)    Pulse 86    Temp 98.1 F (36.7 C) (Oral)    Resp 20    Ht 6\' 3"  (1.905 m)    Wt 225 lb 15.5 oz (102.5 kg)    SpO2 100%    BMI 28.24 kg/m   Visual  Acuity Right Eye Distance:   Left Eye Distance:   Bilateral Distance:    Right Eye Near:   Left Eye Near:    Bilateral Near:     Physical Exam Vitals and nursing note reviewed.  Constitutional:      General: He is awake.     Appearance: He is well-developed and well-groomed.     Comments: Patient is standing in the exam room and appears uncomfortable due to pain. He is in no acute distress but refused to sit or lay down for examination.  HENT:     Head: Normocephalic and atraumatic.     Right Ear: Hearing normal.     Left Ear: Hearing normal.  Eyes:     Extraocular Movements: Extraocular movements intact.     Conjunctiva/sclera: Conjunctivae normal.  Cardiovascular:     Rate and Rhythm: Normal rate and regular rhythm.     Heart sounds: Normal heart sounds. No murmur heard. Pulmonary:     Effort: Pulmonary effort is normal. No respiratory distress.     Breath sounds: Normal breath sounds and air entry. No decreased air movement. No decreased breath sounds, wheezing, rhonchi or rales.  Abdominal:     General: Abdomen is flat. There is no distension.     Palpations: Abdomen is soft.     Tenderness: There is no abdominal tenderness. There is no right CVA tenderness, left CVA tenderness or guarding.     Comments: Abdominal exam performed with patient standing so exam findings are limited.   Genitourinary:    Comments: Did not perform genital exam Musculoskeletal:        General: No swelling or tenderness.     Cervical back: Normal and normal range of motion.     Thoracic back: Normal.     Lumbar back: No swelling, edema, deformity, signs of trauma or tenderness. Decreased range of motion.       Back:     Right lower leg: No edema.     Left lower leg: No edema.     Comments: Patient with decreased range of motion of lower back- refused to perform flexion or rotation. Pain with any movement. No tenderness to palpation of lower back or no CVA tenderness. No distinct muscle spasms  present. No rash or bruising. Unable to assess other neurological aspects of exam with patient standing.   Skin:    General: Skin is warm and dry.     Findings: No rash.  Neurological:     General: No focal deficit present.     Mental Status: He is alert and oriented to person, place, and time.     Sensory: Sensation is intact.     Motor: Motor function is intact.     Gait: Gait is intact.  Psychiatric:        Attention and Perception: Attention normal.        Speech: Speech is delayed.        Behavior: Behavior is slowed.     Comments: Difficulty eliciting history from patient- patient had hard time focusing on questions and had flight of ideas. He continued to emphasize pain level and abnormal findings of urine. Would not allow provider to ask more focused questions and would not answer questions appropriately. Speech and thoughts were often delayed but continued to talk randomly.       UC Treatments / Results  Labs (all labs ordered are listed, but only abnormal results are displayed) Labs Reviewed  URINALYSIS, [place] MICROSCOPIC    EKG   Radiology DG Abdomen 1 View  Result Date: 05/08/2021 CLINICAL DATA:  Back pain, left flank pain EXAM: ABDOMEN - 1 VIEW COMPARISON:  10/02/2019 FINDINGS: Bowel gas pattern is nonspecific. There is small stool burden in the colon. There is no fecal impaction in the rectosigmoid. No abnormal masses or calcifications are seen. Kidneys are partly obscured by bowel contents. Visualized lower lung fields and bony structures are unremarkable. IMPRESSION: No radiographic abnormality is seen in the abdomen. Electronically Signed   By: Prudy Feeler.D.  On: 05/08/2021 16:45    Procedures Procedures (including critical care time)  Medications Ordered in UC Medications - No data to display  Initial Impression / Assessment and Plan / UC Course  I have reviewed the triage vital signs and the nursing notes.  Pertinent labs & imaging  results that were available during my care of the patient were reviewed by me and considered in my medical decision making (see chart for details).    Reviewed urinalysis results with patient- Urine is light yellow and no blood, protein, bilirubin or WBC's seen. Discussed urine is often positive for blood and protein with kidney stones but not always. No signs of UTI.  Performed KUB/flat abdominal x-ray- discussed that stool in intestines is blocking image of kidney and unable to assess if renal calculi is present. Patient was arguing about other view of imaging and why can't other images be done here today. Reviewed that ultrasound or CT scan would be more appropriate and more accurate evaluation for renal calculi and not available at Urgent Care today (Sunday). Would need to see Urologist or go to the ER for this evaluation.  Again, reviewed with patient that pain with movement of his back most likely would indicate musculoskeletal issue and not renal calculi exclusively. Offered Toradol but patient concerned over type of medication and if we gave that to him now, would he be able to get other medication if he went to the ER. Discussed that Toradol is an anti-inflammatory medication similar to Aleve and should help with pain. Patient finally agreed to get Toradol 60mg  IM now. Reviewed again that muscle relaxers may also be helpful for his back pain. He argued that he did not want anything sedating but then the next moment wanted something to help with pain and was okay with staying at home if pain level was better. Trial Flexeril 10mg  every 8 hours as needed- can cause drowsiness. Encouraged to continue to push fluids. Note written for work. If urine output continues to be low and pain continues, go to the ER ASAP. Otherwise, follow-up with your Urologist and PCP tomorrow for further evaluation.   Final Clinical Impressions(s) / UC Diagnoses   Final diagnoses:  Acute left-sided low back pain without  sciatica  Urinary frequency  Decreased urine output     Discharge Instructions      You were given a shot of Toradol today to help with pain. May also try Flexeril 10mg  muscle relaxer- take 1 every 8 hours as needed- can cause drowsiness. If urine output continues to be low and pain continues, go to the ER ASAP. Otherwise, follow-up with a Urologist and PCP for further evaluation.     ED Prescriptions     Medication Sig Dispense Auth. Provider   cyclobenzaprine (FLEXERIL) 10 MG tablet Take 1 tablet (10 mg total) by mouth every 8 (eight) hours as needed for muscle spasms. 15 tablet Keyshon Stein, Nicholes Stairs, NP      PDMP not reviewed this encounter.   Katy Apo, NP 05/09/21 1406

## 2021-05-10 ENCOUNTER — Other Ambulatory Visit: Payer: Self-pay | Admitting: Urology

## 2021-05-10 ENCOUNTER — Other Ambulatory Visit: Payer: Self-pay

## 2021-05-10 ENCOUNTER — Emergency Department
Admission: EM | Admit: 2021-05-10 | Discharge: 2021-05-10 | Disposition: A | Discharge status: Home / Self Care | Payer: BC Managed Care – PPO | Attending: Emergency Medicine | Admitting: Emergency Medicine

## 2021-05-10 DIAGNOSIS — I10 Essential (primary) hypertension: Secondary | ICD-10-CM | POA: Diagnosis not present

## 2021-05-10 DIAGNOSIS — Z79899 Other long term (current) drug therapy: Secondary | ICD-10-CM | POA: Insufficient documentation

## 2021-05-10 DIAGNOSIS — E349 Endocrine disorder, unspecified: Secondary | ICD-10-CM

## 2021-05-10 DIAGNOSIS — M545 Low back pain, unspecified: Secondary | ICD-10-CM | POA: Insufficient documentation

## 2021-05-10 MED ORDER — HYDROMORPHONE HCL 1 MG/ML IJ SOLN
1.0000 mg | Freq: Once | INTRAMUSCULAR | Status: AC
  Administered 2021-05-10: 1 mg via INTRAVENOUS
  Filled 2021-05-10: qty 1

## 2021-05-10 MED ORDER — DEXAMETHASONE SODIUM PHOSPHATE 10 MG/ML IJ SOLN
10.0000 mg | Freq: Once | INTRAMUSCULAR | Status: AC
  Administered 2021-05-10: 10 mg via INTRAVENOUS
  Filled 2021-05-10: qty 1

## 2021-05-10 MED ORDER — ONDANSETRON HCL 4 MG/2ML IJ SOLN
4.0000 mg | Freq: Once | INTRAMUSCULAR | Status: AC
  Administered 2021-05-10: 4 mg via INTRAVENOUS
  Filled 2021-05-10: qty 2

## 2021-05-10 MED ORDER — KETOROLAC TROMETHAMINE 30 MG/ML IJ SOLN
30.0000 mg | Freq: Once | INTRAMUSCULAR | Status: AC
  Administered 2021-05-10: 30 mg via INTRAVENOUS
  Filled 2021-05-10: qty 1

## 2021-05-10 MED ORDER — IBUPROFEN 800 MG PO TABS: 800.0000 mg | ORAL_TABLET | Freq: Three times a day (TID) | ORAL | 0 refills | Status: DC | PRN

## 2021-05-10 MED ORDER — ONDANSETRON 4 MG PO TBDP: 4.0000 mg | ORAL_TABLET | Freq: Four times a day (QID) | ORAL | 0 refills | Status: DC | PRN

## 2021-05-10 MED ORDER — PREDNISONE 10 MG (21) PO TBPK: ORAL_TABLET | ORAL | 0 refills | Status: DC

## 2021-05-10 MED ORDER — OXYCODONE-ACETAMINOPHEN 5-325 MG PO TABS: 2.0000 | ORAL_TABLET | Freq: Four times a day (QID) | ORAL | 0 refills | Status: DC | PRN

## 2021-05-10 NOTE — ED Triage Notes (Signed)
Pt presents to ER via ems from home c/o intermittent back pain x3 weeks.  Pt states pain is worse when sitting/bending.  Pt states pain is shooting and goes down to buttocks and legs. Pt has been seen at [facility] and had xrays and UA done.  Pt A&O x4 at this time.  Pt having to stand in triage d/t pain.

## 2021-05-10 NOTE — Discharge Instructions (Addendum)

## 2021-05-10 NOTE — ED Provider Notes (Signed)
[facility] Provider Note    Event Date/Time   First MD Initiated Contact with Patient 05/10/21 [phone]     (approximate)   History   Back Pain   HPI  RONDALE WASSENBERG is a 31 y.o. male with history of hypertension, ADHD who presents to the emergency department with severe, sharp left lower back pain that has been ongoing intermittently for the past 3 months.  Worse over the past week.  Worse with bending over and sitting down.  Denies any numbness, tingling, weakness, bowel or bladder incontinence, urinary retention, fever.  No history of back surgery or epidural injections.  No history of diabetes, HIV, cancer.  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  He was seen at urgent care and states he has had negative x-rays and was put on Flexeril which gives him some relief in pain.  He reports he did notice some hematuria a couple of times over the past few weeks and was seen by urology yesterday and had a CT of his abdomen pelvis that showed left-sided nephrolithiasis but no ureterolithiasis or other acute abnormality.   History provided by patient and girlfriend.    Past Medical History:  Diagnosis Date   Allergies    Hypertension     Past Surgical History:  Procedure Laterality Date   NO PAST SURGERIES      MEDICATIONS:  Prior to Admission medications   Medication Sig Start Date End Date Taking? Authorizing Provider  amLODipine (NORVASC) 10 MG tablet Take 10 mg by mouth daily. 04/14/19   [provider]  cyanocobalamin 1000 MCG tablet Take 2 tablets daily for 2 weeks, then reduce to 1 tablet daily thereafter for Vitamin B12 Deficiency. 03/19/19   [provider]  cyclobenzaprine (FLEXERIL) 10 MG tablet Take 1 tablet (10 mg total) by mouth every 8 (eight) hours as needed for muscle spasms. 05/08/21   Katy Apo, NP  loratadine (CLARITIN) 10 MG tablet Take 10 mg by mouth daily. 04/14/19   [provider]  losartan (COZAAR) 50 MG tablet TAKE 1/2 TABLET DAILY FOR 1 WEEK, THEN INCREASE TO 1 WHOLE TABLET. 04/14/19   [provider]  NEEDLE, DISP, 18 G (BD DISP NEEDLES) 18G X 1-1/2" MISC 1 mg by Does not apply route every 14 (fourteen) days. 01/05/20   McGowan, Larene Beach A, PA-C  NEEDLE, DISP, 23 G 23G X 1" MISC 0.75 mLs by Does not apply route every 14 (fourteen) days. 01/05/20   Zara Council A, PA-C  Syringe, Disposable, (2-3CC SYRINGE) 3 ML MISC 1 mg by Does not apply route every 14 (fourteen) days. 01/05/20   Zara Council A, PA-C  testosterone cypionate (DEPOTESTOSTERONE CYPIONATE) 200 MG/ML injection Inject 0.75 cc every 14 days 12/20/20   Nori Riis, PA-C    Physical Exam   Triage Vital Signs: ED Triage Vitals  Enc Vitals Group     BP 05/10/21 0135 (!) 143/88     Pulse Rate 05/10/21 0135 80     Resp 05/10/21 0135 16     Temp 05/10/21 0135 97.6 F (36.4 C)     Temp Source 05/10/21 0135 Oral     SpO2 05/10/21 0135 99 %     Weight --      Height --      Head Circumference --      Peak Flow --      Pain Score 05/10/21 0136 4  Pain Loc --      Pain Edu? --      Excl. in [place]? --     Most recent vital signs: Vitals:   05/10/21 0135 05/10/21 0555  BP: (!) 143/88 (!) 133/91  Pulse: 80 89  Resp: 16 18  Temp: 97.6 F (36.4 C)   SpO2: 99% 98%    CONSTITUTIONAL: Alert and oriented and responds appropriately to questions. Well-appearing; well-nourished HEAD: Normocephalic, atraumatic EYES: Conjunctivae clear, pupils appear equal, sclera nonicteric ENT: normal nose; moist mucous membranes NECK: Supple, normal ROM CARD: RRR; S1 and S2 appreciated; no murmurs, no clicks, no rubs, no gallops RESP: Normal chest excursion without splinting or tachypnea; breath sounds clear and equal bilaterally; no wheezes, no rhonchi, no rales, no hypoxia or respiratory distress, speaking full sentences ABD/GI: Normal bowel sounds; non-distended; soft, non-tender, no rebound, no  guarding, no peritoneal signs BACK: The back appears normal, tender to palpation over the left lower lumbar paraspinal muscles without redness, warmth, soft tissue swelling, rash or other lesions.  No midline spinal tenderness or step-off or deformity. EXT: Normal ROM in all joints; no deformity noted, no edema; no cyanosis SKIN: Normal color for age and race; warm; no rash on exposed skin NEURO: Moves all extremities equally, normal speech, no clonus, 2+ deep tendon reflexes in bilateral lower extremities, no saddle anesthesia, normal sensation, normal strength in bilateral lower extremities, normal gait PSYCH: The patient's mood and manner are appropriate.   ED Results / Procedures / Treatments   LABS: (all labs ordered are listed, but only abnormal results are displayed) Labs Reviewed - No data to display   EKG:  EKG Interpretation  Date/Time:    Ventricular Rate:    PR Interval:    QRS Duration:   QT Interval:    QTC Calculation:   R Axis:     Text Interpretation:           RADIOLOGY: My personal review and interpretation of imaging: CT renal study shows no acute abnormality.  I have personally reviewed all radiology reports.   CT RENAL STONE STUDY  Result Date: 05/09/2021 CLINICAL DATA:  Gross hematuria. EXAM: CT ABDOMEN AND PELVIS WITHOUT CONTRAST TECHNIQUE: Multidetector CT imaging of the abdomen and pelvis was performed following the standard protocol without IV contrast. RADIATION DOSE REDUCTION: This exam was performed according to the departmental dose-optimization program which includes automated exposure control, adjustment of the mA and/or kV according to patient size and/or use of iterative reconstruction technique. COMPARISON:  10/02/2019 FINDINGS: Lower chest: No acute abnormality. Hepatobiliary: No focal liver abnormality is seen. No gallstones, gallbladder wall thickening, or biliary dilatation. Pancreas: Unremarkable. No pancreatic ductal dilatation or  surrounding inflammatory changes. Spleen: Normal in size without focal abnormality. Adrenals/Urinary Tract: Normal adrenal glands. Stone within the upper pole collecting system of the left kidney measures 3 mm, image 30/2. No additional kidney stones identified bilaterally. No hydronephrosis or mass identified. Urinary bladder is unremarkable. Stomach/Bowel: Stomach appears normal. The appendix is visualized and is within normal limits. No bowel wall thickening, inflammation, or distension. Vascular/Lymphatic: No significant vascular findings are present. No enlarged abdominal or pelvic lymph nodes. Reproductive: Prostate is unremarkable. Other: No ascites or focal fluid collections. Musculoskeletal: No acute or significant osseous findings. IMPRESSION: 1. No acute findings within the abdomen or pelvis. 2. Nonobstructing left renal calculus. Electronically Signed   By: Kerby Moors M.D.   On: 05/09/2021 16:49     PROCEDURES:  Critical Care performed: No  Procedures    IMPRESSION / MDM / ASSESSMENT AND PLAN / ED COURSE  I reviewed the triage vital signs and the nursing notes.    Patient here with back pain that seems musculoskeletal in nature.  No red flag symptoms, neurologic deficits.  The patient is on the cardiac monitor to evaluate for evidence of arrhythmia and/or significant heart rate changes.   DIFFERENTIAL DIAGNOSIS (includes but not limited to):   Muscle strain, muscle spasm, sciatica, radiculopathy.  Less likely cauda equina, epidural abscess or hematoma, discitis or osteomyelitis, fracture, transverse myelitis.   PLAN: We will give IV pain medication and reassess.   MEDICATIONS GIVEN IN ED: Medications  HYDROmorphone (DILAUDID) injection 1 mg (1 mg Intravenous Given 05/10/21 0616)  ondansetron (ZOFRAN) injection 4 mg (4 mg Intravenous Given 05/10/21 0616)  dexamethasone (DECADRON) injection 10 mg (10 mg Intravenous Given 05/10/21 0615)  ketorolac (TORADOL) 30 MG/ML  injection 30 mg (30 mg Intravenous Given 05/10/21 0616)     ED COURSE: I have reviewed patient's urology note from today as well as his CT scan and urine which shows no significant abnormality.  Discussed these findings with patient as he states he had not yet received the results.   7:08 AM  Pt reports feeling much better.  He has been ambulatory.  Will discharge with prescription of Percocet, ibuprofen and steroid taper.  Have encouraged him to follow-up with his primary care doctor for further management of this acute on chronic back pain.  Again discussed with patient that I do not see any signs of any neurosurgical emergency today and I do not feel he needs further emergent imaging but have strongly encourage close outpatient follow-up and have discussed return precautions.  Patient and partner at bedside are comfortable with this plan.   At this time, I do not feel there is any life-threatening condition present. I reviewed all nursing notes, vitals, pertinent previous records.  All lab and urine results, EKGs, imaging ordered have been independently reviewed and interpreted by myself.  I reviewed all available radiology reports from any imaging ordered this visit.  Based on my assessment, I feel the patient is safe to be discharged home without further emergent workup and can continue workup as an outpatient as needed. Discussed all findings, treatment plan as well as usual and customary return precautions with patient and fianc.  They verbalize understanding and are comfortable with this plan.  Outpatient follow-up has been provided as needed.  All questions have been answered.   CONSULTS: No admission needed at this time given patient's pain is improved and he is neurologically intact without red flag symptoms.   OUTSIDE RECORDS REVIEWED: Reviewed recent urology note from yesterday.         FINAL CLINICAL IMPRESSION(S) / ED DIAGNOSES   Final diagnoses:  Acute left-sided low back  pain without sciatica     Rx / DC Orders   ED Discharge Orders          Ordered    oxyCODONE-acetaminophen (PERCOCET) 5-325 MG tablet  Every 6 hours PRN        05/10/21 0703    ibuprofen (ADVIL) 800 MG tablet  Every 8 hours PRN        05/10/21 0703    predniSONE (STERAPRED UNI-PAK 21 TAB) 10 MG (21) TBPK tablet        05/10/21 0703    ondansetron (ZOFRAN-ODT) 4 MG disintegrating tablet  Every 6 hours PRN        05/10/21  [id number]             Note:  This document was prepared using Dragon voice recognition software and may include unintentional dictation errors.   Rayyan Orsborn, Delice Bison, DO 05/10/21 [phone]

## 2021-05-10 NOTE — ED Notes (Signed)
Per Dr. York Cerise, no lab tests or imaging needed at this time.

## 2021-05-11 ENCOUNTER — Other Ambulatory Visit: Payer: Self-pay | Admitting: *Deleted

## 2021-05-11 DIAGNOSIS — E349 Endocrine disorder, unspecified: Secondary | ICD-10-CM

## 2021-05-11 LAB — URINE CULTURE: Culture: <10000 — AB

## 2021-05-19 ENCOUNTER — Other Ambulatory Visit: Payer: Self-pay

## 2021-05-19 ENCOUNTER — Other Ambulatory Visit: Payer: BC Managed Care – PPO

## 2021-05-19 DIAGNOSIS — E349 Endocrine disorder, unspecified: Secondary | ICD-10-CM

## 2021-05-20 LAB — HEMOGLOBIN AND HEMATOCRIT, BLOOD
Hematocrit: 47.6 % (ref 37.5–51.0)
Hemoglobin: 16.6 g/dL (ref 13.0–17.7)

## 2021-05-20 LAB — TESTOSTERONE: Testosterone: 902 ng/dL (ref 264–916)

## 2021-05-25 ENCOUNTER — Telehealth: Payer: Self-pay | Admitting: Urology

## 2021-05-25 NOTE — Telephone Encounter (Signed)
Patient called and was wanting to discuss his lab results.  He stated that he is waiting for a rx to be sent to [facility] in [place].  He stated that he is due for an injection this weekend

## 2021-05-26 NOTE — Telephone Encounter (Signed)
Please advise on lab results.

## 2021-05-27 ENCOUNTER — Other Ambulatory Visit: Payer: Self-pay | Admitting: Urology

## 2021-05-27 DIAGNOSIS — E349 Endocrine disorder, unspecified: Secondary | ICD-10-CM

## 2021-06-02 ENCOUNTER — Other Ambulatory Visit: Payer: Self-pay | Admitting: Physician Assistant

## 2021-06-02 DIAGNOSIS — M545 Low back pain, unspecified: Secondary | ICD-10-CM

## 2021-06-02 NOTE — Telephone Encounter (Signed)
Patient called back and is wanting to discuss his lab results.  I informed patient that his testosterone injection was sent to his pharmacy.  He said he would still like a call back to discuss labs

## 2021-06-06 NOTE — Telephone Encounter (Signed)
Per DPR detailed message left on VM, advised pt to call if any further questions.

## 2021-06-11 ENCOUNTER — Ambulatory Visit
Admission: RE | Admit: 2021-06-11 | Discharge: 2021-06-11 | Disposition: A | Discharge status: Home / Self Care | Payer: BC Managed Care – PPO | Source: Ambulatory Visit | Attending: Physician Assistant | Admitting: Physician Assistant

## 2021-06-11 DIAGNOSIS — M545 Low back pain, unspecified: Secondary | ICD-10-CM | POA: Insufficient documentation

## 2021-06-17 ENCOUNTER — Telehealth: Payer: Self-pay | Admitting: *Deleted

## 2021-06-17 NOTE — Telephone Encounter (Signed)
Prior auth sent to [facility] for testosterone cypionate. ?

## 2021-06-20 ENCOUNTER — Other Ambulatory Visit: Payer: Managed Care, Other (non HMO)

## 2021-06-26 NOTE — Progress Notes (Incomplete)
06/26/21 ?10:31 AM  ? ?Ch[place] ?01-23-1991 ?[id number] ? ?Referring provider:  ?Latanya Maudlin, NP ?[address] ?[place],  [place] [address] ?No chief complaint on file. ? ? ?Urological history  ?1. Testosterone deficiency ?-contributing factors of former marijuana use ?-testosterone 902 05/19/2021 ?-hemoglobin and HCT are WNL 05/19/2021 ?-injecting testosterone cypionate 29m/mL, 0.75 cc every two weeks ?  ?2. ED ?-contributing factors of testosterone deficiency and smoking ?  ?3. Nephrolithiasis ?- Small left renal stone found incidentally on CT scan 09/2019 ? - KUB on 05/09/2021 showed no stone burden  ? ?4. LU TS ? ?5. Gross hematuria ?-patient could not tolerate laying recumbent long enough to complete the CT urogram  ? ?HPI: ?Steve KERNODLEis a 31y.o.male who returns today for a 6 month follow-up. ? ?He reports *** ? ? ?Patient denies any modifying or aggravating factors.  Patient denies any gross hematuria, dysuria or suprapubic/flank pain.  Patient denies any fevers, chills, nausea or vomiting.  ? ? ? ? ? ? ?PMH: ?Past Medical History:  ?Diagnosis Date  ? Allergies   ? Hypertension   ? ? ?Surgical History: ?Past Surgical History:  ?Procedure Laterality Date  ? NO PAST SURGERIES    ? ? ?Home Medications:  ?Allergies as of 06/27/2021   ?No Known Allergies ?  ? ?  ?Medication List  ?  ? ?  ? Accurate as of June 26, 2021 10:31 AM. If you have any questions, ask your nurse or doctor.  ?  ?  ? ?  ? ?2-3CC SYRINGE 3 ML Misc ?1 mg by Does not apply route every 14 (fourteen) days. ?  ?amLODipine 10 MG tablet ?Commonly known as: NORVASC ?Take 10 mg by mouth daily. ?  ?BD Disp Needles 18G X 1-1/2" Misc ?Generic drug: NEEDLE (DISP) 18 G ?1 mg by Does not apply route every 14 (fourteen) days. ?  ?cyanocobalamin 1000 MCG tablet ?Take 2 tablets daily for 2 weeks, then reduce to 1 tablet daily thereafter for Vitamin B12 Deficiency. ?  ?cyclobenzaprine 10 MG tablet ?Commonly known as: FLEXERIL ?Take 1 tablet  (10 mg total) by mouth every 8 (eight) hours as needed for muscle spasms. ?  ?ibuprofen 800 MG tablet ?Commonly known as: ADVIL ?Take 1 tablet (800 mg total) by mouth every 8 (eight) hours as needed for mild pain. ?  ?loratadine 10 MG tablet ?Commonly known as: CLARITIN ?Take 10 mg by mouth daily. ?  ?losartan 50 MG tablet ?Commonly known as: COZAAR ?TAKE 1/2 TABLET DAILY FOR 1 WEEK, THEN INCREASE TO 1 WHOLE TABLET. ?  ?NEEDLE (DISP) 23 G 23G X 1" Misc ?0.75 mLs by Does not apply route every 14 (fourteen) days. ?  ?ondansetron 4 MG disintegrating tablet ?Commonly known as: ZOFRAN-ODT ?Take 1 tablet (4 mg total) by mouth every 6 (six) hours as needed for nausea or vomiting. ?  ?oxyCODONE-acetaminophen 5-325 MG tablet ?Commonly known as: Percocet ?Take 2 tablets by mouth every 6 (six) hours as needed for severe pain. ?  ?predniSONE 10 MG (21) Tbpk tablet ?Commonly known as: STERAPRED UNI-PAK 21 TAB ?Take as directed ?  ?testosterone cypionate 200 MG/ML injection ?Commonly known as: DEPOTESTOSTERONE CYPIONATE ?INJECT 0.75 ML EVERY 14 DAYS ?  ? ?  ? ? ?Allergies:  ?No Known Allergies ? ?Family History: ?Family History  ?Problem Relation Age of Onset  ? Colon cancer Mother   ? Hypertension Mother   ? Thyroid disease Mother   ? Diabetes Father   ?  Hypertension Father   ? ? ?Social History:  reports that he has been smoking cigarettes. He has been smoking an average of .5 packs per day. He has quit using smokeless tobacco. He reports current alcohol use. He reports that he does not currently use drugs. ? ? ?Physical Exam: ?There were no vitals taken for this visit.  ?Constitutional:  Alert and oriented, No acute distress. ?HEENT: [place] AT, moist mucus membranes.  Trachea midline, no masses. ?Cardiovascular: No clubbing, cyanosis, or edema. ?Respiratory: Normal respiratory effort, no increased work of breathing. ?GI: Abdomen is soft, nontender, nondistended, no abdominal masses ?GU: No CVA tenderness ?Lymph: No cervical or  inguinal lymphadenopathy. ?Skin: No rashes, bruises or suspicious lesions. ?Neurologic: Grossly intact, no focal deficits, moving all 4 extremities. ?Psychiatric: Normal mood and affect. ? ?Laboratory Data: ?Lab Results  ?Component Value Date  ? CREATININE 1.07 08/25/2020  ? ?Component ?    Latest Ref Rng & Units 05/19/2021  ?Hemoglobin ?    13.0 - 17.7 g/dL 16.6  ?HCT ?    37.5 - 51.0 % 47.6  ? ?Component ?    Latest Ref Rng & Units 05/19/2021  ?Testosterone ?    264 - 916 ng/dL 902  ? ? Ref Range & Units 1 mo ago  ?Glucose 70 - 110 mg/dL 69 Low    ?Sodium 136 - 145 mmol/L 139   ?Potassium 3.6 - 5.1 mmol/L 4.9   ?Chloride 97 - 109 mmol/L 102   ?Carbon Dioxide (CO2) 22.0 - 32.0 mmol/L 28.9   ?Urea Nitrogen (BUN) 7 - 25 mg/dL 11   ?Creatinine 0.7 - 1.3 mg/dL 1.0   ?Glomerular Filtration Rate (eGFR), MDRD Estimate >60 mL/min/1.73sq m 88   ?Calcium 8.7 - 10.3 mg/dL 9.6   ?AST  8 - 39 U/L 17   ?ALT  6 - 57 U/L 34   ?Alk Phos (alkaline Phosphatase) 34 - 104 U/L 64   ?Albumin 3.5 - 4.8 g/dL 4.4   ?Bilirubin, Total 0.3 - 1.2 mg/dL 0.5   ?Protein, Total 6.1 - 7.9 g/dL 7.0   ?A/G Ratio 1.0 - 5.0 gm/dL 1.7   ?Resulting Agency  [facility]  ?Specimen Collected: 05/20/21 16:19 Last Resulted: 05/23/21 14:30  ?Received From: [facility]  Result Received: 06/17/21 09:37  ? ?Urinalysis ? ? ?Pertinent Imaging: ? ? ?Assessment & Plan:   ? ? ?No follow-ups on file. ? ?[facility] ?[address], [address] ?[place], [place] [address] ?[phone] ? ?I,Kailey Littlejohn,acting as a Education administrator for [employer], PA-C.,have documented all relevant documentation on the behalf of [place], PA-C,as directed by  [facility], PA-C while in the presence of [place], PA-C. ? ?

## 2021-06-27 ENCOUNTER — Ambulatory Visit: Payer: Managed Care, Other (non HMO) | Admitting: Urology

## 2021-06-29 NOTE — Telephone Encounter (Signed)
I spoke with Michiel Cowboy PA-C, patient needs repeat testosterone level and a follow up visit. Left message for pt. ?

## 2021-06-29 NOTE — Telephone Encounter (Signed)
Left message for pt to return my call. Looks like insurance denied testosterone as his testosterone level was normal. ?

## 2021-07-05 ENCOUNTER — Encounter: Payer: Self-pay | Admitting: Urology

## 2021-07-05 NOTE — Telephone Encounter (Signed)
Error

## 2021-07-08 ENCOUNTER — Other Ambulatory Visit: Payer: Self-pay

## 2021-07-08 ENCOUNTER — Encounter: Payer: Self-pay | Admitting: Emergency Medicine

## 2021-07-08 ENCOUNTER — Ambulatory Visit
Admission: EM | Admit: 2021-07-08 | Discharge: 2021-07-08 | Disposition: A | Discharge status: Home / Self Care | Payer: BC Managed Care – PPO | Attending: Physician Assistant | Admitting: Physician Assistant

## 2021-07-08 DIAGNOSIS — L03119 Cellulitis of unspecified part of limb: Secondary | ICD-10-CM | POA: Diagnosis not present

## 2021-07-08 DIAGNOSIS — L02419 Cutaneous abscess of limb, unspecified: Secondary | ICD-10-CM | POA: Diagnosis not present

## 2021-07-08 MED ORDER — DOXYCYCLINE HYCLATE 100 MG PO CAPS: 100.0000 mg | ORAL_CAPSULE | Freq: Two times a day (BID) | ORAL | 0 refills | Status: AC

## 2021-07-08 MED ORDER — IBUPROFEN 800 MG PO TABS: 800.0000 mg | ORAL_TABLET | Freq: Three times a day (TID) | ORAL | 0 refills | Status: AC | PRN

## 2021-07-08 MED ORDER — CEPHALEXIN 500 MG PO CAPS: 500.0000 mg | ORAL_CAPSULE | Freq: Four times a day (QID) | ORAL | 0 refills | Status: AC

## 2021-07-08 NOTE — ED Triage Notes (Signed)
Pt c/o insect bite on her right upper leg. Started about 3 days ago. He states it has been growing bigger since it started. He states it is red, sore and itchy.  ?

## 2021-07-08 NOTE — ED Provider Notes (Signed)
?[facility] ? ? ? ?CSN: [id number] ?Arrival date & time: 07/08/21  1456 ? ? ?  ? ?History   ?Chief Complaint ?Chief Complaint  ?Patient presents with  ? Insect Bite  ? ? ?HPI ?Steve Nielsen is a 30 y.o. male presenting for suspected insect bite.  Patient says that he noticed a small area of redness of the right thigh 4 days ago.  Patient reports over the past 2 days it has become significantly larger and more red.  He says now he has a lot of pain in his right thigh muscle and into his knee and hip.  Patient reports increased pain when he bears weight on his right leg.  He says he did not see anything bite or sting him but he did see a spider at the time he noticed his rash.  He says it was about the size of a dime but look like a brown recluse.  Patient has not treated condition in any way.  He has not had any fevers or joint swelling.  He denies any history of recurrent skin conditions or MRSA.  No other complaints. ? ?HPI ? ?Past Medical History:  ?Diagnosis Date  ? Allergies   ? Hypertension   ? ? ?Patient Active Problem List  ? Diagnosis Date Noted  ? Gastroesophageal reflux disease without esophagitis 09/19/2019  ? Migraine without aura and without status migrainosus, not intractable 04/14/2019  ? Low vitamin B12 level 03/19/2019  ? Low testosterone 03/19/2019  ? Current smoker 03/17/2019  ? Essential hypertension 03/17/2019  ? Lethargy 03/17/2019  ? ? ?Past Surgical History:  ?Procedure Laterality Date  ? NO PAST SURGERIES    ? ? ? ? ? ?Home Medications   ? ?Prior to Admission medications   ?Medication Sig Start Date End Date Taking? Authorizing Provider  ?amLODipine (NORVASC) 10 MG tablet Take 10 mg by mouth daily. 04/14/19  Yes [provider]  ?cephALEXin (KEFLEX) 500 MG capsule Take 1 capsule (500 mg total) by mouth 4 (four) times daily for 7 days. 07/08/21 07/15/21 Yes Eusebio Friendly B, PA-C  ?cyanocobalamin 1000 MCG tablet Take 2 tablets daily for 2 weeks, then reduce to 1 tablet daily  thereafter for Vitamin B12 Deficiency. 03/19/19  Yes [provider]  ?doxycycline (VIBRAMYCIN) 100 MG capsule Take 1 capsule (100 mg total) by mouth 2 (two) times daily for 7 days. 07/08/21 07/15/21 Yes Shirlee Latch, PA-C  ?loratadine (CLARITIN) 10 MG tablet Take 10 mg by mouth daily. 04/14/19  Yes [provider]  ?losartan (COZAAR) 50 MG tablet TAKE 1/2 TABLET DAILY FOR 1 WEEK, THEN INCREASE TO 1 WHOLE TABLET. 04/14/19  Yes [provider]  ?cyclobenzaprine (FLEXERIL) 10 MG tablet Take 1 tablet (10 mg total) by mouth every 8 (eight) hours as needed for muscle spasms. 05/08/21   Sudie Grumbling, NP  ?ibuprofen (ADVIL) 800 MG tablet Take 1 tablet (800 mg total) by mouth every 8 (eight) hours as needed for up to 7 days for mild pain. 07/08/21 07/15/21  Shirlee Latch, PA-C  ?NEEDLE, DISP, 18 G (BD DISP NEEDLES) 18G X 1-1/2" MISC 1 mg by Does not apply route every 14 (fourteen) days. 01/05/20   Harle Battiest, PA-C  ?NEEDLE, DISP, 23 G 23G X 1" MISC 0.75 mLs by Does not apply route every 14 (fourteen) days. 01/05/20   Michiel Cowboy A, PA-C  ?ondansetron (ZOFRAN-ODT) 4 MG disintegrating tablet Take 1 tablet (4 mg total) by mouth every 6 (  six) hours as needed for nausea or vomiting. 05/10/21   Ward, Layla Maw, DO  ?oxyCODONE-acetaminophen (PERCOCET) 5-325 MG tablet Take 2 tablets by mouth every 6 (six) hours as needed for severe pain. 05/10/21 05/10/22  Ward, Layla Maw, DO  ?predniSONE (STERAPRED UNI-PAK 21 TAB) 10 MG (21) TBPK tablet Take as directed 05/10/21   Ward, Layla Maw, DO  ?Syringe, Disposable, (2-3CC SYRINGE) 3 ML MISC 1 mg by Does not apply route every 14 (fourteen) days. 01/05/20   Michiel Cowboy A, PA-C  ?testosterone cypionate (DEPOTESTOSTERONE CYPIONATE) 200 MG/ML injection INJECT 0.75 ML EVERY 14 DAYS 06/02/21   Michiel Cowboy A, PA-C  ? ? ?Family History ?Family History  ?Problem Relation Age of Onset  ? Colon cancer Mother   ? Hypertension Mother   ? Thyroid disease Mother   ?  Diabetes Father   ? Hypertension Father   ? ? ?Social History ?Social History  ? ?Tobacco Use  ? Smoking status: Some Days  ?  Packs/day: 0.50  ?  Types: Cigarettes  ? Smokeless tobacco: Former  ?Vaping Use  ? Vaping Use: Every day  ?Substance Use Topics  ? Alcohol use: Yes  ?  Comment: occasional  ? Drug use: Not Currently  ? ? ? ?Allergies   ?Patient has no known allergies. ? ? ?Review of Systems ?Review of Systems  ?Constitutional:  Positive for fatigue. Negative for fever.  ?Musculoskeletal:  Positive for arthralgias, gait problem (pain with bearing weight on right leg) and myalgias. Negative for joint swelling.  ?Skin:  Positive for color change and rash.  ?Neurological:  Negative for weakness and numbness.  ? ? ?Physical Exam ?Triage Vital Signs ?ED Triage Vitals  ?Enc Vitals Group  ?   BP 07/08/21 1549 123/76  ?   Pulse Rate 07/08/21 1549 100  ?   Resp 07/08/21 1549 18  ?   Temp 07/08/21 1549 98.5 ?F (36.9 ?C)  ?   Temp Source 07/08/21 1549 Oral  ?   SpO2 07/08/21 1549 98 %  ?   Weight 07/08/21 1546 220 lb 0.3 oz (99.8 kg)  ?   Height 07/08/21 1546 6\' 3"  (1.905 m)  ?   Head Circumference --   ?   Peak Flow --   ?   Pain Score 07/08/21 1545 7  ?   Pain Loc --   ?   Pain Edu? --   ?   Excl. in GC? --   ? ?No data found. ? ?Updated Vital Signs ?BP 123/76 (BP Location: Right Arm)   Pulse 100   Temp 98.5 ?F (36.9 ?C) (Oral)   Resp 18   Ht 6\' 3"  (1.905 m)   Wt 220 lb 0.3 oz (99.8 kg)   SpO2 98%   BMI 27.50 kg/m?  ?   ? ?Physical Exam ?Vitals and nursing note reviewed.  ?Constitutional:   ?   General: He is not in acute distress. ?   Appearance: Normal appearance. He is well-developed. He is not ill-appearing.  ?HENT:  ?   Head: Normocephalic and atraumatic.  ?Eyes:  ?   General: No scleral icterus. ?   Conjunctiva/sclera: Conjunctivae normal.  ?Cardiovascular:  ?   Rate and Rhythm: Normal rate and regular rhythm.  ?   Heart sounds: Normal heart sounds.  ?Pulmonary:  ?   Effort: Pulmonary effort is normal. No  respiratory distress.  ?   Breath sounds: Normal breath sounds.  ?Musculoskeletal:  ?   Cervical back: Neck supple.  ?  Skin: ?   General: Skin is warm and dry.  ?   Capillary Refill: Capillary refill takes less than 2 seconds.  ?   Comments: Right anterior lateral thigh: Central dark erythematous/warm area of induration without fluctuance measures 4 cm x 4 cm.  There is a much larger lighter erythematous/warm region which measures 13 cm x 19 cm.  Area exquisitely tender to palpation.  ?Neurological:  ?   General: No focal deficit present.  ?   Mental Status: He is alert. Mental status is at baseline.  ?   Motor: No weakness.  ?   Coordination: Coordination normal.  ?   Gait: Gait normal.  ?Psychiatric:     ?   Mood and Affect: Mood normal.     ?   Behavior: Behavior normal.     ?   Thought Content: Thought content normal.  ? ? ? ? ?UC Treatments / Results  ?Labs ?(all labs ordered are listed, but only abnormal results are displayed) ?Labs Reviewed - No data to display ? ?EKG ? ? ?Radiology ?No results found. ? ?Procedures ?Procedures (including critical care time) ? ?Medications Ordered in UC ?Medications - No data to display ? ?Initial Impression / Assessment and Plan / UC Course  ?I have reviewed the triage vital signs and the nursing notes. ? ?Pertinent labs & imaging results that were available during my care of the patient were reviewed by me and considered in my medical decision making (see chart for details). ? ?31 year old male presenting for painful area of erythema of the right anterior/lateral thigh x4 days.  I have included an image.  I also measured the area and drew a line around the area of redness.  He has a small indurated abscess that measures 4 cm x 4 cm.  It is without fluctuance.  He has an area of 13 cm x 19 cm of erythema outside of that.  The entire area is extremely painful even with light touch.  Vitals are stable and he does not have fever.  He appears very uncomfortable.  Patient believes  he could have been bitten by apartment close but he is not sure.  Patient has cellulitis/abscess.  Treating with doxycycline and Keflex.  Also sent ibuprofen for acute pain relief and advised warm compresse

## 2021-07-08 NOTE — Discharge Instructions (Addendum)
-  You have an infection of your leg.  It is significant so I'm putting you on 2 different antibiotics.  Also drawn a line around the area of redness.  This should be shrinking and not spreading.  If it is not shrinking in the next 2 days or it spreads beyond the line or you develop a fever or worsening symptoms need to go to the emergency department as you may need IV antibiotics. ?- Ibuprofen and/or Tylenol as needed for pain relief but pain should be improving the next couple of days.  Finish antibiotics. ?- You more than welcome to stop back by here this weekend if needed as I will be here all weekend and I can take another look at your leg. ?

## 2021-07-13 NOTE — Progress Notes (Signed)
07/21/21 ?8:22 AM  ? ?Steve Nielsen ?May 24, 1990 ?[id number] ? ?Referring provider:  ?[facility] ?[address] ?[place],  [place] [address] ? ?Chief Complaint  ?Patient presents with  ? Testosterone deficiency  ? ?Urological history  ?1. Testosterone deficiency ?-contributing factors of former marijuana use ?-testosterone 902 05/19/2021  ?-hemoglobin and HCT are WNL on 05/19/2021 ?-injecting testosterone cypionate 200mg /mL, 0.75 cc every two weeks ?  ?2. ED ?-contributing factors of testosterone deficiency and smoking ?- SHIM 24 ? ?3. Nephrolithiasis ?- Small left renal stone found incidentally on CT scan 09/2019 ?- KUB on 05/08/2021 visualized no radiographic abnormality seen in the abdomen  ?- CT renal stone study visualized no acute findings within the abdomen or pelvis and nonobstructing left renal calculus  ?  ?4. LU TS ?- IPSS 8/2 ? ? ?HPI: ?Steve Nielsen is a 31 y.o.male who presents today for follow-up on testosterone deficiency.  ? ?He reports today that he has not been on testosterone injection since 05/26/2021 due to change in his insurance.  ? ?He is having urinary issues which he attributes to his fluid intake.  Patient denies any modifying or aggravating factors.  Patient denies any gross hematuria, dysuria or suprapubic/flank pain.  Patient denies any fevers, chills, nausea or vomiting.   ? ? IPSS   ? ? Row Name 07/14/21 1100  ?  ?  ?  ? International Prostate Symptom Score  ? How often have you had the sensation of not emptying your bladder? Not at All    ? How often have you had to urinate less than every two hours? About half the time    ? How often have you found you stopped and started again several times when you urinated? Not at All    ? How often have you found it difficult to postpone urination? Almost always    ? How often have you had a weak urinary stream? Not at All    ? How often have you had to strain to start urination? Not at All    ? How many times did you typically get up  at night to urinate? None    ? Total IPSS Score 8    ?  ? Quality of Life due to urinary symptoms  ? If you were to spend the rest of your life with your urinary condition just the way it is now how would you feel about that? Mostly Satisfied    ? ?  ?  ? ?  ? ? ?Score:  ?1-7 Mild ?8-19 Moderate ?20-35 Severe ? ?Patient still having spontaneous erections.  He denies any pain or curvature with erections.   ? ? SHIM   ? ? Row Name 07/14/21 1135  ?  ?  ?  ? SHIM: Over the last 6 months:  ? How do you rate your confidence that you could get and keep an erection? High    ? When you had erections with sexual stimulation, how often were your erections hard enough for penetration (entering your partner)? Almost Always or Always    ? During sexual intercourse, how often were you able to maintain your erection after you had penetrated (entered) your partner? Almost Always or Always    ? During sexual intercourse, how difficult was it to maintain your erection to completion of intercourse? Not Difficult    ? When you attempted sexual intercourse, how often was it satisfactory for you? Almost Always or Always    ?  ?  SHIM Total Score  ? SHIM 24    ? ?  ?  ? ?  ? ? ? ? ?PMH: ?Past Medical History:  ?Diagnosis Date  ? Allergies   ? Hypertension   ? ? ?Surgical History: ?Past Surgical History:  ?Procedure Laterality Date  ? NO PAST SURGERIES    ? ? ?Home Medications:  ?Allergies as of 07/14/2021   ?No Known Allergies ?  ? ?  ?Medication List  ?  ? ?  ? Accurate as of July 14, 2021 11:59 PM. If you have any questions, ask your nurse or doctor.  ?  ?  ? ?  ? ?STOP taking these medications   ? ?cyclobenzaprine 10 MG tablet ?Commonly known as: FLEXERIL ?  ?ondansetron 4 MG disintegrating tablet ?Commonly known as: ZOFRAN-ODT ?  ?oxyCODONE-acetaminophen 5-325 MG tablet ?Commonly known as: Percocet ?  ?predniSONE 10 MG (21) Tbpk tablet ?Commonly known as: STERAPRED UNI-PAK 21 TAB ?  ? ?  ? ?TAKE these medications   ? ?2-3CC SYRINGE 3 ML  Misc ?1 mg by Does not apply route every 14 (fourteen) days. ?  ?amLODipine 10 MG tablet ?Commonly known as: NORVASC ?Take 10 mg by mouth daily. ?  ?BD Disp Needles 18G X 1-1/2" Misc ?Generic drug: NEEDLE (DISP) 18 G ?1 mg by Does not apply route every 14 (fourteen) days. ?  ?cephALEXin 500 MG capsule ?Commonly known as: KEFLEX ?Take 1 capsule (500 mg total) by mouth 4 (four) times daily for 7 days. ?  ?cyanocobalamin 1000 MCG tablet ?Take 2 tablets daily for 2 weeks, then reduce to 1 tablet daily thereafter for Vitamin B12 Deficiency. ?  ?doxycycline 100 MG capsule ?Commonly known as: VIBRAMYCIN ?Take 1 capsule (100 mg total) by mouth 2 (two) times daily for 7 days. ?  ?ibuprofen 800 MG tablet ?Commonly known as: ADVIL ?Take 1 tablet (800 mg total) by mouth every 8 (eight) hours as needed for up to 7 days for mild pain. ?  ?loratadine 10 MG tablet ?Commonly known as: CLARITIN ?Take 10 mg by mouth daily. ?  ?losartan 50 MG tablet ?Commonly known as: COZAAR ?TAKE 1/2 TABLET DAILY FOR 1 WEEK, THEN INCREASE TO 1 WHOLE TABLET. ?  ?NEEDLE (DISP) 23 G 23G X 1" Misc ?0.75 mLs by Does not apply route every 14 (fourteen) days. ?  ?testosterone cypionate 200 MG/ML injection ?Commonly known as: DEPOTESTOSTERONE CYPIONATE ?INJECT 0.75 ML EVERY 14 DAYS ?  ? ?  ? ? ?Allergies:  ?No Known Allergies ? ?Family History: ?Family History  ?Problem Relation Age of Onset  ? Colon cancer Mother   ? Hypertension Mother   ? Thyroid disease Mother   ? Diabetes Father   ? Hypertension Father   ? ? ?Social History:  reports that he has been smoking cigarettes. He has been smoking an average of .5 packs per day. He has quit using smokeless tobacco. He reports current alcohol use. He reports that he does not currently use drugs. ? ? ?Physical Exam: ?BP 128/85   Pulse (!) 106   Ht 6\' 3"  (1.905 m)   Wt 230 lb (104.3 kg)   BMI 28.75 kg/m?   ?Constitutional:  Alert and oriented, No acute distress. ?HEENT: [place] AT, moist mucus membranes.  Trachea  midline ?Cardiovascular: No clubbing, cyanosis, or edema. ?Respiratory: Normal respiratory effort, no increased work of breathing. ?Neurologic: Grossly intact, no focal deficits, moving all 4 extremities. ?Psychiatric: Normal mood and affect. ? ? ?Laboratory Data: ?WBC (White Blood Cell  Count) 4.1 - 10.2 10?3/uL 6.1   ?RBC (Red Blood Cell Count) 4.69 - 6.13 10?6/uL 4.82   ?Hemoglobin 14.1 - 18.1 gm/dL 21.1   ?Hematocrit 40.0 - 52.0 % 45.5   ?MCV (Mean Corpuscular Volume) 80.0 - 100.0 fl 94.4   ?MCH (Mean Corpuscular Hemoglobin) 27.0 - 31.2 pg 33.2 High    ?MCHC (Mean Corpuscular Hemoglobin Concentration) 32.0 - 36.0 gm/dL 94.1   ?Platelet Count 150 - 450 10?3/uL 194   ?RDW-CV (Red Cell Distribution Width) 11.6 - 14.8 % 13.1   ?MPV (Mean Platelet Volume) 9.4 - 12.4 fl 11.0   ?Neutrophils 1.50 - 7.80 10?3/uL 3.20   ?Lymphocytes 1.00 - 3.60 10?3/uL 2.20   ?Mixed Count 0.10 - 0.90 10?3/uL 0.70   ?Neutrophil % 32.0 - 70.0 % 51.7   ?Lymphocyte % 10.0 - 50.0 % 36.7   ?Mixed % 3.0 - 14.4 % 11.6   ?Resulting Agency  [facility]  ?Specimen Collected: 05/20/21 16:19 Last Resulted: 05/20/21 16:38  ?Received From: [facility]  Result Received: 06/17/21 09:37  ? ?Color Yellow, Violet, Light Violet, Dark Violet Yellow   ?Clarity Clear, Other Clear   ?Specific Gravity 1.000 - 1.030 1.025   ?pH, Urine 5.0 - 8.0 5.5   ?Protein, Urinalysis Negative, Trace mg/dL 30 Abnormal    ?Glucose, Urinalysis Negative mg/dL Negative   ?Ketones, Urinalysis Negative mg/dL Negative   ?Blood, Urinalysis Negative Negative   ?Nitrite, Urinalysis Negative Negative   ?Leukocyte Esterase, Urinalysis Negative Negative   ?White Blood Cells, Urinalysis None Seen, 0-3 /hpf 0-3   ?Red Blood Cells, Urinalysis None Seen, 0-3 /hpf 0-3   ?Bacteria, Urinalysis None Seen /hpf Few Abnormal    ?Squamous Epithelial Cells, Urinalysis Rare, Few, None Seen /hpf None Seen   ?Resulting Agency  [facility]  ?Specimen  Collected: 05/20/21 16:19 Last Resulted: 05/20/21 16:39  ?Received From: [facility]  Result Received: 06/17/21 09:37  ? ?Glucose 70 - 110 mg/dL 69 Low    ?Sodium 136 - 145 mmol/L 139   ?[place]

## 2021-07-14 ENCOUNTER — Ambulatory Visit: Payer: BC Managed Care – PPO | Admitting: Urology

## 2021-07-14 VITALS — BP 128/85 | HR 106 | Ht 75.0 in | Wt 230.0 lb

## 2021-07-14 DIAGNOSIS — E349 Endocrine disorder, unspecified: Secondary | ICD-10-CM

## 2021-07-14 DIAGNOSIS — R399 Unspecified symptoms and signs involving the genitourinary system: Secondary | ICD-10-CM

## 2021-07-14 DIAGNOSIS — N529 Male erectile dysfunction, unspecified: Secondary | ICD-10-CM

## 2021-07-21 ENCOUNTER — Encounter: Payer: Self-pay | Admitting: Urology

## 2021-07-21 MED ORDER — TESTOSTERONE CYPIONATE 200 MG/ML IM SOLN: INTRAMUSCULAR | 0 refills | Status: DC

## 2021-09-23 ENCOUNTER — Other Ambulatory Visit: Payer: Self-pay | Admitting: Urology

## 2021-09-23 DIAGNOSIS — E349 Endocrine disorder, unspecified: Secondary | ICD-10-CM

## 2021-09-23 NOTE — Telephone Encounter (Signed)
SEE PRIOR MESSAGE 

## 2021-09-23 NOTE — Telephone Encounter (Signed)
Pt called office and said pharmacy is trying to get in touch with [place] about testosterone filled at [facility] in [place].

## 2021-10-19 ENCOUNTER — Other Ambulatory Visit: Payer: Self-pay | Admitting: *Deleted

## 2021-10-19 DIAGNOSIS — E349 Endocrine disorder, unspecified: Secondary | ICD-10-CM

## 2021-10-20 ENCOUNTER — Other Ambulatory Visit: Payer: BC Managed Care – PPO

## 2021-11-05 ENCOUNTER — Other Ambulatory Visit: Payer: Self-pay | Admitting: Urology

## 2021-11-05 DIAGNOSIS — E349 Endocrine disorder, unspecified: Secondary | ICD-10-CM

## 2021-12-20 ENCOUNTER — Other Ambulatory Visit: Payer: Self-pay | Admitting: Urology

## 2021-12-20 DIAGNOSIS — E349 Endocrine disorder, unspecified: Secondary | ICD-10-CM

## 2022-01-27 ENCOUNTER — Ambulatory Visit
Admission: EM | Admit: 2022-01-27 | Discharge: 2022-01-27 | Disposition: A | Discharge status: Home / Self Care | Payer: Self-pay | Attending: Family Medicine | Admitting: Family Medicine

## 2022-01-27 DIAGNOSIS — F17210 Nicotine dependence, cigarettes, uncomplicated: Secondary | ICD-10-CM | POA: Insufficient documentation

## 2022-01-27 DIAGNOSIS — R42 Dizziness and giddiness: Secondary | ICD-10-CM | POA: Insufficient documentation

## 2022-01-27 DIAGNOSIS — J3489 Other specified disorders of nose and nasal sinuses: Secondary | ICD-10-CM | POA: Insufficient documentation

## 2022-01-27 DIAGNOSIS — J988 Other specified respiratory disorders: Secondary | ICD-10-CM | POA: Insufficient documentation

## 2022-01-27 DIAGNOSIS — Z1152 Encounter for screening for COVID-19: Secondary | ICD-10-CM | POA: Insufficient documentation

## 2022-01-27 DIAGNOSIS — J029 Acute pharyngitis, unspecified: Secondary | ICD-10-CM | POA: Insufficient documentation

## 2022-01-27 DIAGNOSIS — M791 Myalgia, unspecified site: Secondary | ICD-10-CM | POA: Insufficient documentation

## 2022-01-27 LAB — RESP PANEL BY RT-PCR (FLU A&B, COVID) ARPGX2
Influenza A by PCR: NEGATIVE
Influenza B by PCR: NEGATIVE
SARS Coronavirus 2 by RT PCR: NEGATIVE

## 2022-01-27 MED ORDER — ALBUTEROL SULFATE HFA 108 (90 BASE) MCG/ACT IN AERS: 2.0000 | INHALATION_SPRAY | RESPIRATORY_TRACT | 0 refills | Status: AC | PRN

## 2022-01-27 MED ORDER — PROMETHAZINE-DM 6.25-15 MG/5ML PO SYRP: 5.0000 mL | ORAL_SOLUTION | Freq: Four times a day (QID) | ORAL | 0 refills | Status: DC | PRN

## 2022-01-27 MED ORDER — BENZONATATE 100 MG PO CAPS: 200.0000 mg | ORAL_CAPSULE | Freq: Three times a day (TID) | ORAL | 0 refills | Status: DC | PRN

## 2022-01-27 MED ORDER — ONDANSETRON HCL 4 MG PO TABS: 4.0000 mg | ORAL_TABLET | Freq: Four times a day (QID) | ORAL | 0 refills | Status: DC

## 2022-01-27 NOTE — ED Triage Notes (Addendum)
Pt c/o sore throat, cough x1 week, pt states hot/cold, cough has been worsening, worse at night, pt states cough makes him throw up

## 2022-01-27 NOTE — ED Provider Notes (Signed)
[facility]    CSN: [id number] Arrival date & time: 01/27/22  1830      History   Chief Complaint Chief Complaint  Patient presents with   Cough   Emesis    HPI Steve Nielsen is a 31 y.o. male.   HPI   Steve Nielsen presents for sore throat and cough. Stared having progressively  worse cough, sinus drainage, chest discomfort. Had EMS came to his house for chest pain and they performed an EKG and gave him aspirin. He told over the counter medication for cough. "Feels like coughing my head off."  His partner had cough and headache. Had chills and was sweating at the same time. Gets dizzy and has not had a appetite.    Fever : undocumented  Chills: yes Sore throat: yes Cough:yes Sputum:yes Nasal congestion : yes Rhinorrhea: yes Myalgias: yes Appetite: yes Hydration: normal Abdominal pain: no Nausea: yes Vomiting: yes  Diarrhea: no Rash: No Sleep disturbance: yes Headache: yes     Past Medical History:  Diagnosis Date   Allergies    Hypertension     Patient Active Problem List   Diagnosis Date Noted   Gastroesophageal reflux disease without esophagitis 09/19/2019   Migraine without aura and without status migrainosus, not intractable 04/14/2019   Low vitamin B12 level 03/19/2019   Low testosterone 03/19/2019   Current smoker 03/17/2019   Essential hypertension 03/17/2019   Lethargy 03/17/2019    Past Surgical History:  Procedure Laterality Date   NO PAST SURGERIES         Home Medications    Prior to Admission medications   Medication Sig Start Date End Date Taking? Authorizing Provider  albuterol (VENTOLIN HFA) 108 (90 Base) MCG/ACT inhaler Inhale 2 puffs into the lungs every 4 (four) hours as needed. 01/27/22  Yes Kirstin Kugler, DO  amLODipine (NORVASC) 10 MG tablet Take 10 mg by mouth daily. 04/14/19  Yes [provider]  benzonatate (TESSALON) 100 MG capsule Take 2 capsules (200 mg total) by mouth 3 (three) times daily as  needed for cough. 01/27/22  Yes Amiayah Giebel, DO  ondansetron (ZOFRAN) 4 MG tablet Take 1 tablet (4 mg total) by mouth every 6 (six) hours. 01/27/22  Yes Duff Pozzi, DO  promethazine-dextromethorphan (PROMETHAZINE-DM) 6.25-15 MG/5ML syrup Take 5 mLs by mouth 4 (four) times daily as needed. 01/27/22  Yes Loreena Valeri, DO  cyanocobalamin 1000 MCG tablet Take 2 tablets daily for 2 weeks, then reduce to 1 tablet daily thereafter for Vitamin B12 Deficiency. 03/19/19   [provider]  loratadine (CLARITIN) 10 MG tablet Take 10 mg by mouth daily. 04/14/19   [provider]  losartan (COZAAR) 50 MG tablet TAKE 1/2 TABLET DAILY FOR 1 WEEK, THEN INCREASE TO 1 WHOLE TABLET. 04/14/19   [provider]  NEEDLE, DISP, 18 G (BD DISP NEEDLES) 18G X 1-1/2" MISC 1 mg by Does not apply route every 14 (fourteen) days. 01/05/20   McGowan, Carollee Herter A, PA-C  NEEDLE, DISP, 23 G 23G X 1" MISC 0.75 mLs by Does not apply route every 14 (fourteen) days. 01/05/20   Michiel Cowboy A, PA-C  Syringe, Disposable, (2-3CC SYRINGE) 3 ML MISC 1 mg by Does not apply route every 14 (fourteen) days. 01/05/20   Michiel Cowboy A, PA-C  testosterone cypionate (DEPOTESTOSTERONE CYPIONATE) 200 MG/ML injection INJECT 0.75 ML EVERY 14 DAYS 09/25/21   McGowan, Wellington Hampshire, PA-C    Family History Family History  Problem Relation Age of Onset  Colon cancer Mother    Hypertension Mother    Thyroid disease Mother    Diabetes Father    Hypertension Father     Social History Social History   Tobacco Use   Smoking status: Some Days    Packs/day: 0.50    Types: Cigarettes   Smokeless tobacco: Former  Building services engineer Use: Every day  Substance Use Topics   Alcohol use: Yes    Comment: occasional   Drug use: Not Currently     Allergies   Patient has no known allergies.   Review of Systems Review of Systems: negative unless otherwise stated in HPI.      Physical Exam Triage Vital Signs ED  Triage Vitals  Enc Vitals Group     BP 01/27/22 1840 105/79     Pulse Rate 01/27/22 1840 99     Resp --      Temp 01/27/22 1840 98.2 F (36.8 C)     Temp Source 01/27/22 1840 Oral     SpO2 01/27/22 1840 100 %     Weight 01/27/22 1839 220 lb (99.8 kg)     Height 01/27/22 1839 6\' 3"  (1.905 m)     Head Circumference --      Peak Flow --      Pain Score 01/27/22 1839 4     Pain Loc --      Pain Edu? --      Excl. in [place]? --    No data found.  Updated Vital Signs BP 105/79 (BP Location: Left Arm)   Pulse 99   Temp 98.2 F (36.8 C) (Oral)   Ht 6\' 3"  (1.905 m)   Wt 99.8 kg   SpO2 100%   BMI 27.50 kg/m   Visual Acuity Right Eye Distance:   Left Eye Distance:   Bilateral Distance:    Right Eye Near:   Left Eye Near:    Bilateral Near:     Physical Exam GEN:     alert, non-toxic appearing male in no distress     HENT:  mucus membranes moist, oropharyngeal without lesions or  exudate, no  tonsillar hypertrophy,   mild oropharyngeal erythema ,   moderate erythematous edematous turbinates,  clear nasal discharge,  bilateral TM  normal EYES:   pupils equal and reactive, EOMi ,  no scleral injection NECK:  normal ROM, bilateral anterior lymphadenopathy RESP:  no increased work of breathing,  clear to auscultation bilaterally CVS:   regular rate  and rhythm Skin:   warm and dry, no rash on visible skin , normal skin turgor    UC Treatments / Results  Labs (all labs ordered are listed, but only abnormal results are displayed) Labs Reviewed  RESP PANEL BY RT-PCR (FLU A&B, COVID) [id number]    EKG   Radiology No results found.  Procedures Procedures (including critical care time)  Medications Ordered in UC Medications - No data to display  Initial Impression / Assessment and Plan / UC Course  I have reviewed the triage vital signs and the nursing notes.  Pertinent labs & imaging results that were available during my care of the patient were reviewed by me and  considered in my medical decision making (see chart for details).       Pt is a 31 y.o. male who presents for 5-6  days of respiratory symptoms. Steve Nielsen is  afebrile here without recent antipyretics. Satting well on room air. Overall pt is  well appearing,  well hydrated, without respiratory distress. Pulmonary exam  is unremarkable.  COVID  and influenza testing obtained.  Pt to quarantine until COVID test results or longer if positive.  I will call patient with test results, if positive. History consistent with  viral respiratory illness. Discussed symptomatic treatment.  Explained lack of efficacy of antibiotics in viral disease.  Typical duration of symptoms discussed. Prescribed Tessalon perles and promethazine DM for cough. Given albuterol inhaler for chest tightness and intermittent shortness of breath. Return and ED precautions given and patient voiced understanding. Discussed MDM, treatment plan and plan for follow-up with patient who agrees with plan.     Final Clinical Impressions(s) / UC Diagnoses   Final diagnoses:  Respiratory tract infection     Discharge Instructions      We will contact you if your COVID or influenza test is positive.  Please quarantine while you wait for the results.  If your test is negative you may resume normal activities.  If your test is positive please continue to quarantine for at least 5 days from your symptom onset or until you are without a fever for at least 24 hours after the medications.   You can take Tylenol and/or Ibuprofen as needed for fever reduction and pain relief.    For cough: honey 1/2 to 1 teaspoon (you can dilute the honey in water or another fluid).  You can also use guaifenesin and dextromethorphan for cough. You can use a humidifier for chest congestion and cough.  If you don't have a humidifier, you can sit in the bathroom with the hot shower running.      For sore throat: try warm salt water gargles, cepacol lozenges, throat  spray, warm tea or water with lemon/honey, popsicles or ice, or OTC cold relief medicine for throat discomfort.    For congestion: take a daily anti-histamine like Zyrtec, Claritin, and a oral decongestant, such as pseudoephedrine.  You can also use Flonase 1-2 sprays in each nostril daily.    It is important to stay hydrated: drink plenty of fluids (water, gatorade/powerade/pedialyte, juices, or teas) to keep your throat moisturized and help further relieve irritation/discomfort.    Return or go to the Emergency Department if symptoms worsen or do not improve in the next few days      ED Prescriptions     Medication Sig Dispense Auth. Provider   promethazine-dextromethorphan (PROMETHAZINE-DM) 6.25-15 MG/5ML syrup Take 5 mLs by mouth 4 (four) times daily as needed. 118 mL Kamyrah Feeser, DO   benzonatate (TESSALON) 100 MG capsule Take 2 capsules (200 mg total) by mouth 3 (three) times daily as needed for cough. 21 capsule Avionna Bower, DO   albuterol (VENTOLIN HFA) 108 (90 Base) MCG/ACT inhaler Inhale 2 puffs into the lungs every 4 (four) hours as needed. 6.7 g Daeshaun Specht, DO   ondansetron (ZOFRAN) 4 MG tablet Take 1 tablet (4 mg total) by mouth every 6 (six) hours. 12 tablet Lyndee Hensen, DO      PDMP not reviewed this encounter.   Lyndee Hensen, DO 01/27/22 1912

## 2022-01-27 NOTE — Discharge Instructions (Addendum)
We will contact you if your COVID or influenza test is positive.  Please quarantine while you wait for the results.  If your test is negative you may resume normal activities.  If your test is positive please continue to quarantine for at least 5 days from your symptom onset or until you are without a fever for at least 24 hours after the medications.   You can take Tylenol and/or Ibuprofen as needed for fever reduction and pain relief.    For cough: honey 1/2 to 1 teaspoon (you can dilute the honey in water or another fluid).  You can also use guaifenesin and dextromethorphan for cough. You can use a humidifier for chest congestion and cough.  If you don't have a humidifier, you can sit in the bathroom with the hot shower running.      For sore throat: try warm salt water gargles, cepacol lozenges, throat spray, warm tea or water with lemon/honey, popsicles or ice, or OTC cold relief medicine for throat discomfort.    For congestion: take a daily anti-histamine like Zyrtec, Claritin, and a oral decongestant, such as pseudoephedrine.  You can also use Flonase 1-2 sprays in each nostril daily.    It is important to stay hydrated: drink plenty of fluids (water, gatorade/powerade/pedialyte, juices, or teas) to keep your throat moisturized and help further relieve irritation/discomfort.    Return or go to the Emergency Department if symptoms worsen or do not improve in the next few days  

## 2022-06-08 ENCOUNTER — Ambulatory Visit
Admission: EM | Admit: 2022-06-08 | Discharge: 2022-06-08 | Disposition: A | Discharge status: Home / Self Care | Payer: 59 | Attending: Emergency Medicine | Admitting: Emergency Medicine

## 2022-06-08 DIAGNOSIS — T1592XA Foreign body on external eye, part unspecified, left eye, initial encounter: Secondary | ICD-10-CM | POA: Diagnosis not present

## 2022-06-08 MED ORDER — TETANUS-DIPHTH-ACELL PERTUSSIS 5-2.5-18.5 LF-MCG/0.5 IM SUSY
0.5000 mL | PREFILLED_SYRINGE | Freq: Once | INTRAMUSCULAR | Status: AC
  Administered 2022-06-08: 0.5 mL via INTRAMUSCULAR

## 2022-06-08 NOTE — ED Triage Notes (Signed)
Pt c/o piece of metal in L eye due being around a grinder at the house which occurred <1 hr ago. Eye itchy,watery & irritating denies any blurry vision.

## 2022-06-08 NOTE — Discharge Instructions (Addendum)
Please go to [facility] in [place] to be examined by Dr. George Ina and to have the metallic foreign body removed from your left eye.  The address is [address] [place], [place].  [address].  The phone number is [phone].

## 2022-06-08 NOTE — ED Provider Notes (Signed)
[facility]    CSN: [id number] Arrival date & time: 06/08/22  1341      History   Chief Complaint Chief Complaint  Patient presents with   Foreign Body in Eye    HPI Steve Nielsen is a 32 y.o. male.   HPI  32 year old male here for evaluation of foreign body in the left eye.  Patient reports that he was struck in the left eye by a piece of metal by someone who is working on a grinder approximately 2 hours prior to arrival.  He states that his eye is irritated and he feels the metal piece scratching the inside of his eyelid every time he blinks.  He denies any blurry vision.  He is unsure when his last tetanus shot was.  Past Medical History:  Diagnosis Date   Allergies    Hypertension     Patient Active Problem List   Diagnosis Date Noted   Gastroesophageal reflux disease without esophagitis 09/19/2019   Migraine without aura and without status migrainosus, not intractable 04/14/2019   Low vitamin B12 level 03/19/2019   Low testosterone 03/19/2019   Current smoker 03/17/2019   Essential hypertension 03/17/2019   Lethargy 03/17/2019    Past Surgical History:  Procedure Laterality Date   NO PAST SURGERIES         Home Medications    Prior to Admission medications   Medication Sig Start Date End Date Taking? Authorizing Provider  albuterol (VENTOLIN HFA) 108 (90 Base) MCG/ACT inhaler Inhale 2 puffs into the lungs every 4 (four) hours as needed. 01/27/22  Yes Brimage, Vondra, DO  amLODipine (NORVASC) 10 MG tablet Take 10 mg by mouth daily. 04/14/19  Yes [provider]  benzonatate (TESSALON) 100 MG capsule Take 2 capsules (200 mg total) by mouth 3 (three) times daily as needed for cough. 01/27/22  Yes Brimage, Vondra, DO  cyanocobalamin 1000 MCG tablet Take 2 tablets daily for 2 weeks, then reduce to 1 tablet daily thereafter for Vitamin B12 Deficiency. 03/19/19  Yes [provider]  loratadine (CLARITIN) 10 MG tablet Take 10 mg by  mouth daily. 04/14/19  Yes [provider]  losartan (COZAAR) 50 MG tablet TAKE 1/2 TABLET DAILY FOR 1 WEEK, THEN INCREASE TO 1 WHOLE TABLET. 04/14/19  Yes [provider]  NEEDLE, DISP, 18 G (BD DISP NEEDLES) 18G X 1-1/2" MISC 1 mg by Does not apply route every 14 (fourteen) days. 01/05/20  Yes McGowan, Larene Beach A, PA-C  NEEDLE, DISP, 23 G 23G X 1" MISC 0.75 mLs by Does not apply route every 14 (fourteen) days. 01/05/20  Yes McGowan, Larene Beach A, PA-C  ondansetron (ZOFRAN) 4 MG tablet Take 1 tablet (4 mg total) by mouth every 6 (six) hours. 01/27/22  Yes Brimage, Vondra, DO  promethazine-dextromethorphan (PROMETHAZINE-DM) 6.25-15 MG/5ML syrup Take 5 mLs by mouth 4 (four) times daily as needed. 01/27/22  Yes Brimage, Vondra, DO  Syringe, Disposable, (2-3CC SYRINGE) 3 ML MISC 1 mg by Does not apply route every 14 (fourteen) days. 01/05/20  Yes McGowan, Larene Beach A, PA-C  testosterone cypionate (DEPOTESTOSTERONE CYPIONATE) 200 MG/ML injection INJECT 0.75 ML EVERY 14 DAYS 09/25/21  Yes McGowan, Hunt Oris, PA-C    Family History Family History  Problem Relation Age of Onset   Colon cancer Mother    Hypertension Mother    Thyroid disease Mother    Diabetes Father    Hypertension Father     Social History Social History   Tobacco Use  Smoking status: Some Days    Packs/day: 0.50    Types: Cigarettes   Smokeless tobacco: Former  Scientific laboratory technician Use: Every day  Substance Use Topics   Alcohol use: Yes    Comment: occasional   Drug use: Not Currently     Allergies   Patient has no known allergies.   Review of Systems Review of Systems  Eyes:  Positive for pain. Negative for discharge and visual disturbance.     Physical Exam Triage Vital Signs ED Triage Vitals  Enc Vitals Group     BP 06/08/22 1423 132/85     Pulse Rate 06/08/22 1423 91     Resp 06/08/22 1423 16     Temp 06/08/22 1423 98.2 F (36.8 C)     Temp Source 06/08/22 1423 Oral     SpO2 06/08/22 1423  100 %     Weight 06/08/22 1422 220 lb (99.8 kg)     Height 06/08/22 1422 '6\' 4"'$  (1.93 m)     Head Circumference --      Peak Flow --      Pain Score 06/08/22 1422 0     Pain Loc --      Pain Edu? --      Excl. in [place]? --    No data found.  Updated Vital Signs BP 132/85 (BP Location: Left Arm)   Pulse 91   Temp 98.2 F (36.8 C) (Oral)   Resp 16   Ht '6\' 4"'$  (1.93 m)   Wt 220 lb (99.8 kg)   SpO2 100%   BMI 26.78 kg/m   Visual Acuity Right Eye Distance: 20/40 Left Eye Distance: 20/30 Bilateral Distance: 20/25 (Without correction)  Right Eye Near:   Left Eye Near:    Bilateral Near:     Physical Exam Vitals and nursing note reviewed.  Constitutional:      Appearance: Normal appearance. He is not ill-appearing.  HENT:     Head: Normocephalic and atraumatic.  Eyes:     Extraocular Movements: Extraocular movements intact.     Conjunctiva/sclera: Conjunctivae normal.     Pupils: Pupils are equal, round, and reactive to light.     Comments: Foreign body in lateral aspect of left eye in the sclera.  Skin:    General: Skin is warm and dry.     Capillary Refill: Capillary refill takes less than 2 seconds.  Neurological:     Mental Status: He is alert.      UC Treatments / Results  Labs (all labs ordered are listed, but only abnormal results are displayed) Labs Reviewed - No data to display  EKG   Radiology No results found.  Procedures Procedures (including critical care time)  Medications Ordered in UC Medications  Tdap (BOOSTRIX) injection 0.5 mL (0.5 mLs Intramuscular Given 06/08/22 1448)    Initial Impression / Assessment and Plan / UC Course  I have reviewed the triage vital signs and the nursing notes.  Pertinent labs & imaging results that were available during my care of the patient were reviewed by me and considered in my medical decision making (see chart for details).   32 year old male with a significant past medical history for hypertension,  GERD, low T, and migraines presenting for evaluation of a foreign body in his left eye that occurred approximately 2 hours prior to arrival.  As can be seen in the image above he has a metal foreign body embedded in the sclera at the 3  o'clock position lateral of the iris.  Patient has significant difficulty holding open, or allowing his to be held open, for examination.  I do not see any other foreign bodies though my exam is limited.  His pupil is equal round and reactive and he has a normal red light reflex in the left eye.  Visual acuity OU 20/25, OD 20/40, OS 20/30.  Given that this is a metallic foreign body he will need a slit-lamp exam and virtual for extraction.  I will contact [facility] in [place] and see if they can see him today in clinic.  [facility] will see him in their office in [place] immediately.  Dr. George Ina will be attending.   Final Clinical Impressions(s) / UC Diagnoses   Final diagnoses:  Foreign body of eye, external, left, initial encounter     Discharge Instructions      Please go to [facility] in [place] to be examined by Dr. George Ina and to have the metallic foreign body removed from your left eye.  The address is [address], [place], [place].  [address].  The phone number is [phone].     ED Prescriptions   None    PDMP not reviewed this encounter.   Margarette Canada, NP 06/08/22 1454

## 2022-06-16 ENCOUNTER — Telehealth: Payer: Self-pay | Admitting: *Deleted

## 2022-06-16 NOTE — Telephone Encounter (Signed)
Tried to get a approval for testosterone and the insurance  will have on file was saying that your insurance has been termed . The patient is not found with the information provided - Member Termed. Patient needs to update his insurance with [place].

## 2022-10-22 ENCOUNTER — Ambulatory Visit
Admission: EM | Admit: 2022-10-22 | Discharge: 2022-10-22 | Disposition: A | Discharge status: Home / Self Care | Payer: 59 | Attending: Family Medicine | Admitting: Family Medicine

## 2022-10-22 DIAGNOSIS — L03031 Cellulitis of right toe: Secondary | ICD-10-CM

## 2022-10-22 MED ORDER — SULFAMETHOXAZOLE-TRIMETHOPRIM 800-160 MG PO TABS: 1.0000 | ORAL_TABLET | Freq: Two times a day (BID) | ORAL | 0 refills | Status: AC

## 2022-10-22 NOTE — ED Triage Notes (Signed)
Pt c/o R big toe infection x7 days. States toe draining,red & tender.

## 2022-10-22 NOTE — Discharge Instructions (Addendum)
Call the podiatrist to get your toenail removed when your pain is better controlled and hopefully your infection has resolved prior to that visit.

## 2022-10-22 NOTE — ED Provider Notes (Signed)
[facility]    CSN: [id number] Arrival date & time: 10/22/22  1531      History   Chief Complaint Chief Complaint  Patient presents with   Nail Problem    HPI Steve Nielsen is Nielsen 32 y.o. male.   HPI  Steve Nielsen presents for 1-2 weeks of right great toe pain. Toes is red and swollen and he thinks its infected.  Has pain with walking. Has had intermittent ingrown toenails.  No fever. Took Nielsen Goody powder last night. Steve Nielsen has otherwise been well and has no other concerns.     Past Medical History:  Diagnosis Date   Allergies    Hypertension     Patient Active Problem List   Diagnosis Date Noted   Gastroesophageal reflux disease without esophagitis 09/19/2019   Migraine without aura and without status migrainosus, not intractable 04/14/2019   Low vitamin B12 level 03/19/2019   Low testosterone 03/19/2019   Current smoker 03/17/2019   Essential hypertension 03/17/2019   Lethargy 03/17/2019    Past Surgical History:  Procedure Laterality Date   NO PAST SURGERIES         Home Medications    Prior to Admission medications   Medication Sig Start Date End Date Taking? Authorizing Provider  albuterol (VENTOLIN HFA) 108 (90 Base) MCG/ACT inhaler Inhale 2 puffs into the lungs every 4 (four) hours as needed. 01/27/22  Yes Steve Fayson, DO  amLODipine (NORVASC) 10 MG tablet Take 10 mg by mouth daily. 04/14/19  Yes [provider]  cyanocobalamin 1000 MCG tablet Take 2 tablets daily for 2 weeks, then reduce to 1 tablet daily thereafter for Vitamin B12 Deficiency. 03/19/19  Yes [provider]  loratadine (CLARITIN) 10 MG tablet Take 10 mg by mouth daily. 04/14/19  Yes [provider]  losartan (COZAAR) 50 MG tablet TAKE 1/2 TABLET DAILY FOR 1 WEEK, THEN INCREASE TO 1 WHOLE TABLET. 04/14/19  Yes [provider]  NEEDLE, DISP, 18 G (BD DISP NEEDLES) 18G X 1-1/2" MISC 1 mg by Does not apply route every 14 (fourteen) days. 01/05/20  Yes  McGowan, Carollee Herter A, PA-C  NEEDLE, DISP, 23 G 23G X 1" MISC 0.75 mLs by Does not apply route every 14 (fourteen) days. 01/05/20  Yes McGowan, Carollee Herter A, PA-C  sulfamethoxazole-trimethoprim (BACTRIM DS) 800-160 MG tablet Take 1 tablet by mouth 2 (two) times daily for 7 days. 10/22/22 10/29/22 Yes Steve Drahos, DO  Syringe, Disposable, (2-3CC SYRINGE) 3 ML MISC 1 mg by Does not apply route every 14 (fourteen) days. 01/05/20  Yes McGowan, Carollee Herter A, PA-C  testosterone cypionate (DEPOTESTOSTERONE CYPIONATE) 200 MG/ML injection INJECT 0.75 ML EVERY 14 DAYS 09/25/21  Yes McGowan, Wellington Hampshire, PA-C    Family History Family History  Problem Relation Age of Onset   Colon cancer Mother    Hypertension Mother    Thyroid disease Mother    Diabetes Father    Hypertension Father     Social History Social History   Tobacco Use   Smoking status: Some Days    Current packs/day: 0.50    Types: Cigarettes   Smokeless tobacco: Former  Building services engineer status: Every Day  Substance Use Topics   Alcohol use: Yes    Comment: occasional   Drug use: Not Currently     Allergies   Patient has no known allergies.   Review of Systems Review of Systems :negative unless otherwise stated in HPI.      Physical  Exam Triage Vital Signs ED Triage Vitals  Encounter Vitals Group     BP 10/22/22 1536 129/82     Systolic BP Percentile --      Diastolic BP Percentile --      Pulse Rate 10/22/22 1536 80     Resp 10/22/22 1536 16     Temp 10/22/22 1536 98.4 F (36.9 C)     Temp Source 10/22/22 1536 Oral     SpO2 10/22/22 1536 100 %     Weight 10/22/22 1535 220 lb (99.8 kg)     Height 10/22/22 1535 6\' 2"  (1.88 m)     Head Circumference --      Peak Flow --      Pain Score 10/22/22 1538 3     Pain Loc --      Pain Education --      Exclude from Growth Chart --    No data found.  Updated Vital Signs BP 129/82 (BP Location: Right Arm)   Pulse 80   Temp 98.4 F (36.9 C) (Oral)   Resp 16   Ht 6'  2" (1.88 m)   Wt 99.8 kg   SpO2 100%   BMI 28.25 kg/m   Visual Acuity Right Eye Distance:   Left Eye Distance:   Bilateral Distance:    Right Eye Near:   Left Eye Near:    Bilateral Near:     Physical Exam  GEN: alert, uncomfortable appearing male, in no acute distress  CV: regular rate, strong DP pulse  RESP: no increased work of breathing MSK: distal right great toe edema and erythema, purulent discharge from lateral nail  NEURO: alert, moves all extremities appropriately, normal gait PSYCH: Normal affect, appropriate speech and behavior  SKIN: warm and dry; erythema and discharge with overgrown tissue that is TTP of distal great toe   UC Treatments / Results  Labs (all labs ordered are listed, but only abnormal results are displayed) Labs Reviewed - No data to display  EKG   Radiology No results found.  Procedures Procedures (including critical care time)  Incision/Drainage of Paronychia Performed UJ:Steve Ahonesty Woodfin, DO Authorized by: Consent: Verbal consent obtained. Risks and benefits: risks, benefits and alternatives were discussed Consent given by: patient or parent Patient understanding: patient states understanding of the procedure being performed Patient consent: the patient's understanding of the procedure matches consent given Patient identity confirmed: verbally  Time out: Immediately prior to procedure Nielsen "time out" was called to verify the correct patient, procedure, equipment, support staff and site/side marked as required. Type: abscess Location details:  right great toe Anesthesia: Topical PainEase spray  Incision type: single straight Complexity: simple Drainage: purulent Drainage amount: moderate Patient tolerance: Patient tolerated the procedure well with no immediate complications.   Medications Ordered in UC Medications - No data to display  Initial Impression / Assessment and Plan / UC Course  I have reviewed the triage vital signs  and the nursing notes.  Pertinent labs & imaging results that were available during my care of the patient were reviewed by me and considered in my medical decision making (see chart for details).     Patient is Nielsen 32 y.o. malewho presents for right great toe pain.  Overall, patient is well-appearing and well-hydrated.  Vital signs stable.  Liandro is afebrile.  Exam concerning for paronychia.  Recommended ingrown toenail removal and I&D and he is agreeable to I&D though procedure. Treat with antibiotics.  Pt to follow up with podiatry  at [facility] when pain subsides.     Reviewed expectations regarding course of current medical issues.  All questions asked were answered.  Outlined signs and symptoms indicating need for more acute intervention. Patient verbalized understanding. After Visit Summary given.   Final Clinical Impressions(s) / UC Diagnoses   Final diagnoses:  Paronychia of great toe of right foot     Discharge Instructions      Call the podiatrist to get your toenail removed when your pain is better controlled and hopefully your infection has resolved prior to that visit.      ED Prescriptions     Medication Sig Dispense Auth. Provider   sulfamethoxazole-trimethoprim (BACTRIM DS) 800-160 MG tablet Take 1 tablet by mouth 2 (two) times daily for 7 days. 14 tablet Haya Hemler, Seward Meth, DO      PDMP not reviewed this encounter.              Katha Cabal, DO 10/25/22 1506

## 2022-12-12 ENCOUNTER — Ambulatory Visit
Admission: EM | Admit: 2022-12-12 | Discharge: 2022-12-12 | Disposition: A | Discharge status: Home / Self Care | Payer: Self-pay | Attending: Physician Assistant | Admitting: Physician Assistant

## 2022-12-12 DIAGNOSIS — L255 Unspecified contact dermatitis due to plants, except food: Secondary | ICD-10-CM

## 2022-12-12 DIAGNOSIS — L03031 Cellulitis of right toe: Secondary | ICD-10-CM

## 2022-12-12 DIAGNOSIS — L282 Other prurigo: Secondary | ICD-10-CM

## 2022-12-12 MED ORDER — METHYLPREDNISOLONE ACETATE 80 MG/ML IJ SUSP
60.0000 mg | Freq: Once | INTRAMUSCULAR | Status: AC
  Administered 2022-12-12: 60 mg via INTRAMUSCULAR

## 2022-12-12 MED ORDER — MUPIROCIN 2 % EX OINT: 1.0000 | TOPICAL_OINTMENT | Freq: Every day | CUTANEOUS | 0 refills | Status: AC

## 2022-12-12 MED ORDER — PREDNISONE 10 MG (21) PO TBPK: ORAL_TABLET | ORAL | 0 refills | Status: AC

## 2022-12-12 MED ORDER — AMOXICILLIN-POT CLAVULANATE 875-125 MG PO TABS: 1.0000 | ORAL_TABLET | Freq: Two times a day (BID) | ORAL | 0 refills | Status: AC

## 2022-12-12 NOTE — ED Provider Notes (Signed)
[facility]    CSN: [id number] Arrival date & time: 12/12/22  1425      History   Chief Complaint Chief Complaint  Patient presents with   Rash   Toe Pain    HPI Steve Nielsen is a 32 y.o. male.   Patient presents today with several concerns.  His primary concern today is a widespread pruritic rash.  Reports that he was out working in his yard when he was exposed to what he believes was poison sumac.  He has been taking Benadryl but despite this medication it has spread to involve more of his body.  Initially symptoms were localized to his hands and wrist but now include both arms and his trunk.  He denies any fever, swelling of his throat, shortness of breath, muffled voice.  Denies any additional new exposures including to medications, antibiotics, personal hygiene products or soaps or detergents.  Denies any recent steroid use.  He denies history of diabetes.  In addition, he reports persistent right great toe pain.  He was seen by clinic on 10/22/2022 at which point paronychia was drained and he was given Bactrim DS.  He reports that this improved but did not resolve.  He notes that he has an ingrown toenail that is contributing to his symptoms.  He attempted to follow-up with podiatry but they will not see him until the ongoing infection has been dealt with.  He denies additional antibiotics in the past 90 days.  Denies history of diabetes.  He denies any associated fever, nausea, vomiting, numbness, paresthesias.  He is on his feet a lot for work and believes that footwear is contributing to his symptoms.    Past Medical History:  Diagnosis Date   Allergies    Hypertension     Patient Active Problem List   Diagnosis Date Noted   Gastroesophageal reflux disease without esophagitis 09/19/2019   Migraine without aura and without status migrainosus, not intractable 04/14/2019   Low vitamin B12 level 03/19/2019   Low testosterone 03/19/2019   Current smoker  03/17/2019   Essential hypertension 03/17/2019   Lethargy 03/17/2019    Past Surgical History:  Procedure Laterality Date   NO PAST SURGERIES         Home Medications    Prior to Admission medications   Medication Sig Start Date End Date Taking? Authorizing Provider  albuterol (VENTOLIN HFA) 108 (90 Base) MCG/ACT inhaler Inhale 2 puffs into the lungs every 4 (four) hours as needed. 01/27/22  Yes Brimage, Vondra, DO  amLODipine (NORVASC) 10 MG tablet Take 10 mg by mouth daily. 04/14/19  Yes [provider]  amoxicillin-clavulanate (AUGMENTIN) 875-125 MG tablet Take 1 tablet by mouth every 12 (twelve) hours. 12/12/22  Yes Leya Paige K, PA-C  cyanocobalamin 1000 MCG tablet Take 2 tablets daily for 2 weeks, then reduce to 1 tablet daily thereafter for Vitamin B12 Deficiency. 03/19/19  Yes [provider]  loratadine (CLARITIN) 10 MG tablet Take 10 mg by mouth daily. 04/14/19  Yes [provider]  losartan (COZAAR) 50 MG tablet TAKE 1/2 TABLET DAILY FOR 1 WEEK, THEN INCREASE TO 1 WHOLE TABLET. 04/14/19  Yes [provider]  mupirocin ointment (BACTROBAN) 2 % Apply 1 Application topically daily. 12/12/22  Yes Lorcan Shelp K, PA-C  NEEDLE, DISP, 18 G (BD DISP NEEDLES) 18G X 1-1/2" MISC 1 mg by Does not apply route every 14 (fourteen) days. 01/05/20  Yes McGowan, Carollee Herter A, PA-C  NEEDLE, DISP, 23 G  23G X 1" MISC 0.75 mLs by Does not apply route every 14 (fourteen) days. 01/05/20  Yes McGowan, Carollee Herter A, PA-C  predniSONE (STERAPRED UNI-PAK 21 TAB) 10 MG (21) TBPK tablet As directed 12/12/22  Yes Arvell Pulsifer K, PA-C  Syringe, Disposable, (2-3CC SYRINGE) 3 ML MISC 1 mg by Does not apply route every 14 (fourteen) days. 01/05/20  Yes McGowan, Carollee Herter A, PA-C  testosterone cypionate (DEPOTESTOSTERONE CYPIONATE) 200 MG/ML injection INJECT 0.75 ML EVERY 14 DAYS 09/25/21  Yes McGowan, Wellington Hampshire, PA-C    Family History Family History  Problem Relation Age of Onset   Colon cancer  Mother    Hypertension Mother    Thyroid disease Mother    Diabetes Father    Hypertension Father     Social History Social History   Tobacco Use   Smoking status: Some Days    Current packs/day: 0.50    Types: Cigarettes   Smokeless tobacco: Former  Building services engineer status: Every Day  Substance Use Topics   Alcohol use: Yes    Comment: occasional   Drug use: Not Currently     Allergies   Patient has no known allergies.   Review of Systems Review of Systems  Constitutional:  Positive for activity change. Negative for appetite change, fatigue and fever.  HENT:  Negative for sore throat, trouble swallowing and voice change.   Gastrointestinal:  Negative for abdominal pain, diarrhea, nausea and vomiting.  Musculoskeletal:  Negative for arthralgias and myalgias.  Skin:  Positive for rash and wound. Negative for color change.  Neurological:  Negative for weakness and numbness.     Physical Exam Triage Vital Signs ED Triage Vitals  Encounter Vitals Group     BP 12/12/22 1503 (!) 142/92     Systolic BP Percentile --      Diastolic BP Percentile --      Pulse Rate 12/12/22 1503 97     Resp 12/12/22 1503 16     Temp 12/12/22 1503 98.5 F (36.9 C)     Temp Source 12/12/22 1503 Oral     SpO2 12/12/22 1503 99 %     Weight 12/12/22 1502 220 lb (99.8 kg)     Height 12/12/22 1502 6\' 2"  (1.88 m)     Head Circumference --      Peak Flow --      Pain Score 12/12/22 1507 0     Pain Loc --      Pain Education --      Exclude from Growth Chart --    No data found.  Updated Vital Signs BP (!) 142/92 (BP Location: Right Arm)   Pulse 97   Temp 98.5 F (36.9 C) (Oral)   Resp 16   Ht 6\' 2"  (1.88 m)   Wt 220 lb (99.8 kg)   SpO2 99%   BMI 28.25 kg/m   Visual Acuity Right Eye Distance:   Left Eye Distance:   Bilateral Distance:    Right Eye Near:   Left Eye Near:    Bilateral Near:     Physical Exam Vitals reviewed.  Constitutional:      General: He is  awake.     Appearance: Normal appearance. He is well-developed. He is not ill-appearing.     Comments: Very pleasant male appears stated age in no acute distress sitting comfortably in exam room  HENT:     Head: Normocephalic and atraumatic.     Mouth/Throat:  Pharynx: Uvula midline. No oropharyngeal exudate, posterior oropharyngeal erythema or uvula swelling.  Cardiovascular:     Rate and Rhythm: Normal rate and regular rhythm.     Heart sounds: Normal heart sounds, S1 normal and S2 normal. No murmur heard. Pulmonary:     Effort: Pulmonary effort is normal.     Breath sounds: Normal breath sounds. No stridor. No wheezing, rhonchi or rales.     Comments: Clear to auscultation bilaterally Feet:     Comments: Swelling with some granulated tissue noted right great toe at lateral nail edge.  Toes neurovascular intact. Skin:    Findings: Rash present. Rash is macular, papular and vesicular.     Comments: Widespread maculopapular rash with several vesicles noted bilateral upper extremities.  Evidence of excoriation noted.  No streaking or evidence of lymphangitis.  Neurological:     Mental Status: He is alert.  Psychiatric:        Behavior: Behavior is cooperative.      UC Treatments / Results  Labs (all labs ordered are listed, but only abnormal results are displayed) Labs Reviewed - No data to display  EKG   Radiology No results found.  Procedures Procedures (including critical care time)  Medications Ordered in UC Medications  methylPREDNISolone acetate (DEPO-MEDROL) injection 60 mg (has no administration in time range)    Initial Impression / Assessment and Plan / UC Course  I have reviewed the triage vital signs and the nursing notes.  Pertinent labs & imaging results that were available during my care of the patient were reviewed by me and considered in my medical decision making (see chart for details).     Rash consistent with Rhus dermatitis.  Given  worsening symptoms including spread of rash will treat with systemic steroids.  He was given 60 mg of Depo-Medrol in clinic as started on prednisone taper.  We discussed that he should not take NSAIDs with prednisone.  Can use over-the-counter H1 and H2 blockade for management of pruritus.  He is to keep area clean and avoid scratching as this can lead to secondary infection.  If he has any worsening or changing symptoms he needs to be seen immediately.  Given he has had this paronychia for several months I did recommend x-ray, however, he is currently without insurance and so declined imaging at this time.  He does report that it is improving but is not yet resolved.  Will cover with Augmentin and he was encouraged to keep the area clean and apply Bactroban ointment daily with dressing changes.  He is to continue using warm salt water soaks.  Ultimately he will need to see a podiatrist for further evaluation and management and was given contact information for local provider.  We discussed that if he has any worsening or changing symptoms he needs to be seen immediately.  Strict return precautions given.  Final Clinical Impressions(s) / UC Diagnoses   Final diagnoses:  Rhus dermatitis  Pruritic rash  Paronychia of great toe, right     Discharge Instructions      We are treating you for poison sumac.  We gave an injection of steroids today.  Start prednisone tomorrow (12/13/2022).  Do not take NSAIDs with this medication including aspirin, ibuprofen/Advil, naproxen/Aleve.  You can use Zyrtec during the day and famotidine (marketed as Pepcid) at night.  This should help with the itching.  If you have any worsening or changing symptoms please return for reevaluation.  Start Augmentin to cover for infection  twice daily for 7 days.  Continue with warm salt water soaks.  Apply Bactroban ointment with dressing changes.  Follow-up with podiatry soon as possible.  Call them to schedule an appointment.  If  anything worsens you need to be seen immediately.     ED Prescriptions     Medication Sig Dispense Auth. Provider   amoxicillin-clavulanate (AUGMENTIN) 875-125 MG tablet Take 1 tablet by mouth every 12 (twelve) hours. 14 tablet Jalani Cullifer K, PA-C   mupirocin ointment (BACTROBAN) 2 % Apply 1 Application topically daily. 22 g Culver Feighner K, PA-C   predniSONE (STERAPRED UNI-PAK 21 TAB) 10 MG (21) TBPK tablet As directed 21 tablet Eura Mccauslin K, PA-C      PDMP not reviewed this encounter.   Jeani Hawking, PA-C 12/12/22 1538

## 2022-12-12 NOTE — Discharge Instructions (Signed)
We are treating you for poison sumac.  We gave an injection of steroids today.  Start prednisone tomorrow (12/13/2022).  Do not take NSAIDs with this medication including aspirin, ibuprofen/Advil, naproxen/Aleve.  You can use Zyrtec during the day and famotidine (marketed as Pepcid) at night.  This should help with the itching.  If you have any worsening or changing symptoms please return for reevaluation.  Start Augmentin to cover for infection twice daily for 7 days.  Continue with warm salt water soaks.  Apply Bactroban ointment with dressing changes.  Follow-up with podiatry soon as possible.  Call them to schedule an appointment.  If anything worsens you need to be seen immediately.

## 2022-12-12 NOTE — ED Triage Notes (Signed)
Pt c/o rash all over body states was around poison sumac x1 wk. Has tried benadryl w/o relief.   Also c/o ingrown toe nail of R big toe. Was seen on 7/14 for the same issue & was given abx w/relief. Req refill on Bactrim.

## 2023-01-29 IMAGING — CR DG ABDOMEN 1V
2 series · 2 of 2 positions shown · non-contrast
Comparison: 10/02/2019

CLINICAL DATA: Back pain, left flank pain

EXAM:
ABDOMEN - 1 VIEW

[abdomen kub (1 of 2)]
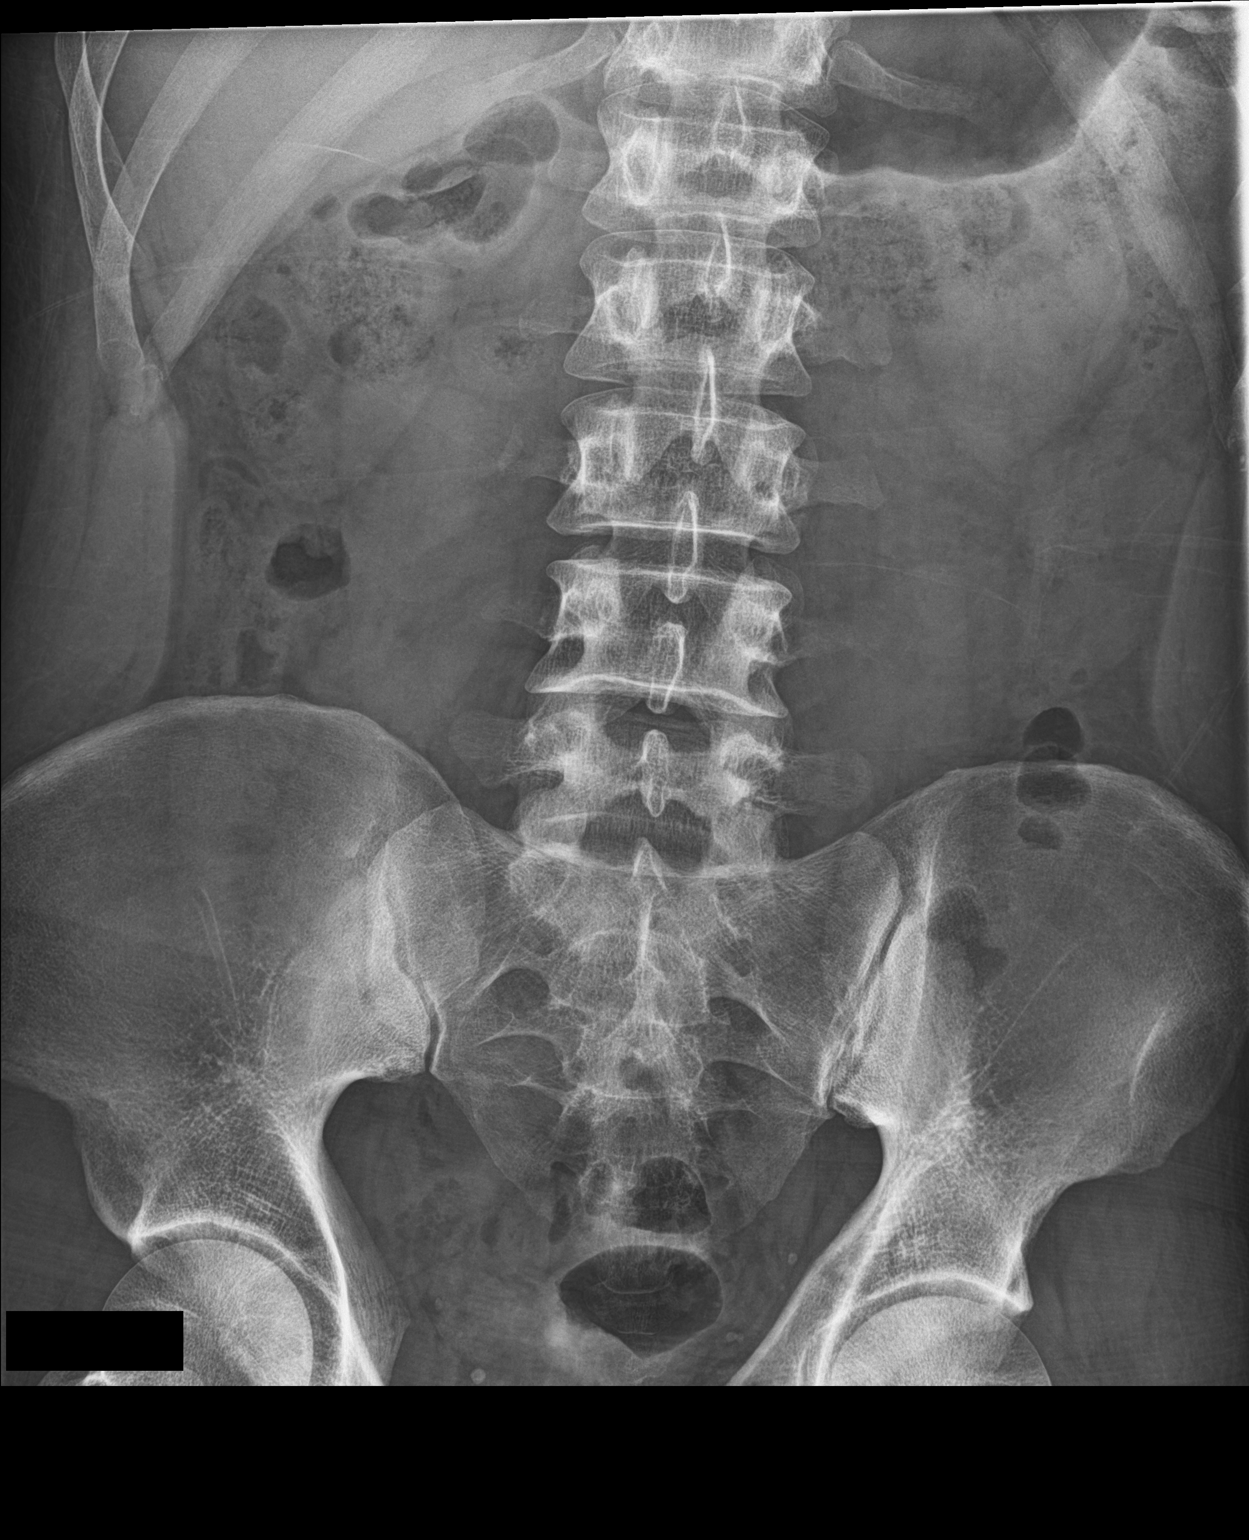

[abdomen kub (2 of 2)]
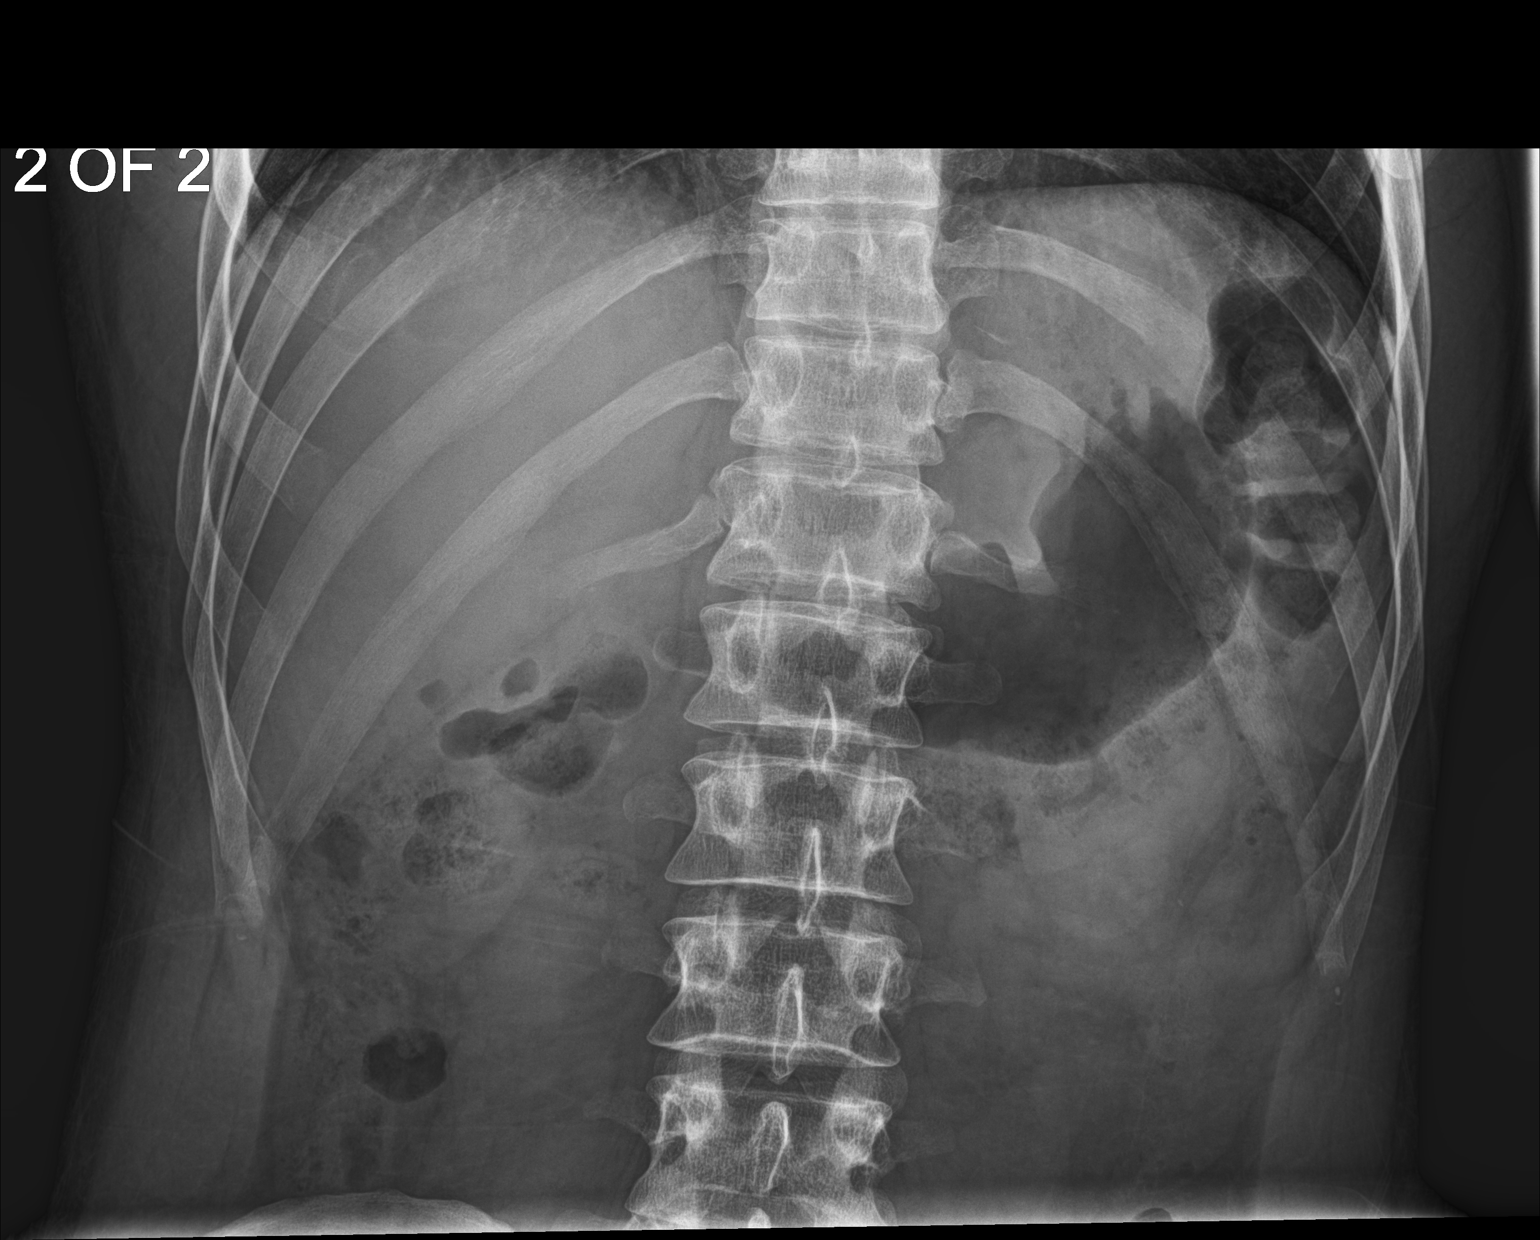

[2 of 2 positions shown; findings below may reference images not displayed]

FINDINGS: Bowel gas pattern is nonspecific. There is small stool burden in the
colon. There is no fecal impaction in the rectosigmoid. No abnormal
masses or calcifications are seen. Kidneys are partly obscured by
bowel contents. Visualized lower lung fields and bony structures are
unremarkable.
IMPRESSION: No radiographic abnormality is seen in the abdomen.
